# Patient Record
Sex: Female | Born: 1970 | Race: White | Hispanic: No | Marital: Married | Smoking: Former smoker
Health system: Southern US, Community
[De-identification: ages and names within clinical notes are randomized; demographics above are authoritative.]

## PROBLEM LIST (undated history)

## (undated) DIAGNOSIS — K219 Gastro-esophageal reflux disease without esophagitis: Secondary | ICD-10-CM

## (undated) HISTORY — DX: Gastro-esophageal reflux disease without esophagitis: K21.9

---

## 2000-05-20 ENCOUNTER — Other Ambulatory Visit: Admission: RE | Admit: 2000-05-20 | Discharge: 2000-05-20 | Payer: Self-pay | Admitting: Obstetrics and Gynecology

## 2000-07-11 ENCOUNTER — Ambulatory Visit (HOSPITAL_COMMUNITY): Admission: RE | Admit: 2000-07-11 | Discharge: 2000-07-11 | Payer: Self-pay | Admitting: Obstetrics and Gynecology

## 2000-07-11 ENCOUNTER — Encounter: Payer: Self-pay | Admitting: Obstetrics and Gynecology

## 2000-09-21 ENCOUNTER — Ambulatory Visit (HOSPITAL_COMMUNITY): Admission: RE | Admit: 2000-09-21 | Discharge: 2000-09-21 | Payer: Self-pay | Admitting: Obstetrics and Gynecology

## 2000-12-07 ENCOUNTER — Inpatient Hospital Stay (HOSPITAL_COMMUNITY): Admission: AD | Admit: 2000-12-07 | Discharge: 2000-12-09 | Payer: Self-pay | Admitting: Obstetrics and Gynecology

## 2001-07-10 ENCOUNTER — Other Ambulatory Visit: Admission: RE | Admit: 2001-07-10 | Discharge: 2001-07-10 | Payer: Self-pay | Admitting: Obstetrics and Gynecology

## 2002-07-10 ENCOUNTER — Other Ambulatory Visit: Admission: RE | Admit: 2002-07-10 | Discharge: 2002-07-10 | Payer: Self-pay | Admitting: Obstetrics and Gynecology

## 2004-07-15 ENCOUNTER — Other Ambulatory Visit: Admission: RE | Admit: 2004-07-15 | Discharge: 2004-07-15 | Payer: Self-pay | Admitting: Obstetrics and Gynecology

## 2006-09-13 HISTORY — PX: ABDOMINAL HYSTERECTOMY: SHX81

## 2006-10-20 ENCOUNTER — Encounter (INDEPENDENT_AMBULATORY_CARE_PROVIDER_SITE_OTHER): Payer: Self-pay | Admitting: Specialist

## 2006-10-20 ENCOUNTER — Ambulatory Visit (HOSPITAL_COMMUNITY): Admission: RE | Admit: 2006-10-20 | Discharge: 2006-10-21 | Payer: Self-pay | Admitting: Obstetrics and Gynecology

## 2008-04-04 ENCOUNTER — Encounter: Admission: RE | Admit: 2008-04-04 | Discharge: 2008-04-04 | Payer: Self-pay | Admitting: Gastroenterology

## 2011-01-29 NOTE — H&P (Signed)
NAMEARTI, Martha Howell              ACCOUNT NO.:  [id number]   MEDICAL RECORD NO.:  [id number]          PATIENT TYPE:  AMB   LOCATION:  SDC                           FACILITY:  [facility]   PHYSICIAN:  Zenaida Niece, M.D.DATE OF BIRTH:  [birth year]   DATE OF ADMISSION:  DATE OF DISCHARGE:                              HISTORY & PHYSICAL   CHIEF COMPLAINT:  Pelvic relaxation and stress urinary incontinence,  abnormal uterine bleeding.   HISTORY OF PRESENT ILLNESS:  This is a 40 year old white female, para 2-  0-3-2, who was seen for an annual exam in October 2007.  At that time,  she was having irregular periods with occasional heavy flow which  varied.  She also complained of stress urinary incontinence which had  been noticed in the past.  She had a saline infusion ultrasound which  revealed a slightly enlarged uterus with a possible large endometrial  lesion.  All options were discussed with the patient.  To complete her  evaluation, she did have urodynamics which did confirm stress urinary  incontinence.  Again, all options were discussed with the patient and  she wishes to proceed with definitive surgical therapy with hysterectomy  and TVT for her stress incontinence.   PAST OB HISTORY:  Significant for one cesarean section at term and one  VBAC as well as two elective terminations and one spontaneous abortion.   PAST MEDICAL HISTORY:  Significantly only for recent fatigue.   PAST SURGICAL HISTORY:  Cesarean section.   ALLERGIES:  None known.   CURRENT MEDICATIONS:  None.   FAMILY HISTORY:  She has a paternal uncle with colon cancer.   SOCIAL HISTORY:  She is married and denies alcohol, tobacco, or drug  use.   REVIEW OF SYSTEMS:  She does have stress urinary incontinence and  occasional constipation.   PHYSICAL EXAMINATION:  GENERAL:  This is a well developed white female  in no acute distress.  VITAL SIGNS:  Weight 194 pounds, blood pressure 122/100.  NECK:  Supple  without lymphadenopathy or thyromegaly.  LUNGS:  Clear to auscultation.  HEART:  Regular rate and rhythm without murmur.  ABDOMEN:  Soft, nontender, nondistended, without palpable masses.  She  does have a well healed transverse scar.  EXTREMITIES:  No edema, nontender.  PELVIC:  External genitalia has no lesions.  On speculum exam, the  cervix is normal, she does have a minimal cystocele.  On bimanual exam,  she has an anteverted uterus that is upper limits of normal size and  nontender and she does a poor Kegel exercise.  She has no adnexal  masses.   ASSESSMENT:  1. Recent abnormal uterine bleeding with a possible endometrial polyp.      All options have been discussed with the patient and she wishes to      proceed with definitive surgical therapy.  All risks of surgery      have been discussed and she agrees to proceed.  2. Stress urinary incontinence.  This is confirmed by urodynamics.      The patient wishes to have this addressed at the same  time as her      surgery.  Again, she understands the risks of surgery.   PLAN:  Admit the patient on the day of surgery for vaginal hysterectomy  and a TVT.  We will do an anterior colporrhaphy if necessary.      Zenaida Niece, M.D.  Electronically Signed     TDM/MEDQ  D:  10/19/2006  T:  10/19/2006  Job:  [id number]

## 2011-01-29 NOTE — Discharge Summary (Signed)
[facility]  Patient:    Martha Howell, Martha Howell                     MRN: [id number] Adm. Date:  [id number] Disc. Date: [id number] Attending:  Michaele Offer                           Discharge Summary  HISTORY:                      A 40 year old white female, para 1-0-3-1, gravida 5, estimated gestational age 78+ weeks by 8-week ultrasound with [facility] of December 01, 2000 presented complaining of regular contractions, no vaginal bleeding or ruptured membranes, and good fetal movement. Vaginal exam in Maternity Admission Unit showed a cervix to be 2-3 cm, 60%, -2 station that progressed to 4 cm, 60%. Blood group and type was B positive with a negative antibody, RPR was nonreactive, rubella nonimmune, hepatitis B surface antigen negative, HIV negative, GC and Chlamydia negative, declined triple screen, one-hour glucola 146, three-hour GTT 86, 143, 140, and 124, group B strep was negative. The patients prenatal course was complicated by anemia treated with iron, thrombosed hemorrhoid, and recent candidiasis treated with Terazol. She had a prior low transverse cervical C-section after a labor that progressed the cervix to 5 cm and delivery of a 9-pound 7-ounce infant.  OBSTETRIC HISTORY:            In 1988 and 1989, early abortions. 1995, spontaneous abortion. 1999, low transverse cervical C-section at 39 weeks, infant weighed 9 pounds 7 ounces, failure to progress in labor beyond 5 cm.  PAST MEDICAL HISTORY:         Negative.  PAST SURGICAL HISTORY:        As stated.  ALLERGIES:                    No known drug allergies.  SOCIAL HISTORY:               Married, no tobacco.  MEDICATIONS:                  Took iron for anemia.  HOSPITAL COURSE:              On admission, she was afebrile with normal vital signs. Fetal heart tones were reactive with a few early decelerations. Contractions were every three to four minutes. By 7:40 a.m. on December 07, 2000 the cervix was 5 cm, 80%, vertex at a -1. The patient had an epidural. At 8:20 a.m. the contractions were widely spaced. We decided to augment with Pitocin. By 9:26 p.m. the cervix had very slowly progressed to 6 cm, 9 cm, and complete dilatation. At 9:26 p.m. the cervix was fully dilated, vertex, and a +3 station. A dime of caput showed with pushing. After two-plus hours of pushing, the patient asked for assisted delivery. An epidural boost was given and Simpson forceps applied. The vertex was drawn down so that at least a grapefruit-sized amount of head was visible. Left mediolateral episiotomy had been cut, forceps were removed. The patient delivered vertex spontaneously ROA. There was shoulder dystocia managed by McRoberts, woodscrew, and suprapubic pressure. The infant was female, 9 pounds 7 ounces. Apgars of 9 at one and 9 at five minutes. The placenta was intact and very large. The uterus remained boggy and was treated with massage, Pitocin, Methergine, and Hemabate.  The cervical C-section scar was intact. The rectum was negative. Left mediolateral episiotomy repair with 3-0 Dexon and a right vaginal laceration, also repaired with the same suture. A laceration superior to the hemorrhoid was not repaired. Blood loss was 1000 cc.  Postpartum the patient did well. On the first postoperative day her main complaint was hemorrhoids which had been present in late pregnancy. Exam showed a large rosette of hemorrhoids, most prominent at 12 oclock and that one might have been thrombosed. Anusol-HC cream was added to the regimen. On the second postpartum day, the patient felt somewhat improved and was ready for discharge.  LABORATORY DATA:              Hemoglobin, even though it remained stable at 9.4, on the first postpartum day it dropped to a more reasonable 7.3.  FINAL DIAGNOSES:              1. Intrauterine pregnancy at 40+ weeks,                                  delivered ROA.                                2. Prior cesarean section, vaginal birth                                  after cesarean section.                               3. Prolonged second stage of labor.                               4. Shoulder dystocia.                               5. Postpartum hemorrhage.  OPERATION:                    1. Spontaneous delivery ROA after low forceps                                  used to bring the vertex onto the perineum.                               2. Left mediolateral episiotomy and repair.                               3. Right vaginal laceration and repair.  FINAL CONDITION:              Improved.  DISCHARGE INSTRUCTIONS:       Instructions include our regular discharge instruction booklet. The patient is to receive rubella vaccine.  DISCHARGE MEDICATIONS:        The patient is given a prescription for Percocet 5/325, 16 tablets, one every four to six hours as needed for pain.  DISCHARGE FOLLOWUP:           The  patient is asked to return in six weeks for followup examination. DD:  12/09/00 TD:  12/10/00 Job: [id number]

## 2011-01-29 NOTE — Op Note (Signed)
Martha Howell, Martha Howell              ACCOUNT NO.:  [id number]   MEDICAL RECORD NO.:  [id number]          PATIENT TYPE:  AMB   LOCATION:  SDC                           FACILITY:  [facility]   PHYSICIAN:  Zenaida Niece, M.D.DATE OF BIRTH:  1971/03/04   DATE OF PROCEDURE:  10/20/2006  DATE OF DISCHARGE:                               OPERATIVE REPORT   PREOPERATIVE DIAGNOSES:  1. Abnormal uterine bleeding.  2. Cystocele.  3. Stress urinary incontinence.   POSTOPERATIVE DIAGNOSES:  1. Abnormal uterine bleeding.  2. Cystocele.  3. Stress urinary incontinence.   PROCEDURES:  1. Transvaginal hysterectomy.  2. Anterior colporrhaphy and TVT SECUR. .   SURGEON:  Lavina Hamman, M.D.   ASSISTANT:  Sherron Monday, M.D.   ANESTHESIA:  General endotracheal tube.   SPECIMENS:  Uterus, sent to Pathology.   ESTIMATED BLOOD LOSS:  100 mL.   COMPLICATIONS:  None.   FINDINGS:  She had a slightly enlarged uterus.  She had a grade 1  cystocele and normal bladder by cystoscopy.   PROCEDURE IN DETAIL:  The patient was taken to the operating room and  placed in the dorsal supine position.  General anesthesia was induced  and she was placed in mobile stirrups.  Perineum and vagina were then  prepped and draped in the usual sterile fashion.  Bladder was drained  with a red rubber catheter.  A weighted speculum was inserted into the  vagina and cervix was grasped with Christella Hartigan tenaculums.  The  cervicovaginal mucosa was infiltrated with a dilute solution of  Pitressin and incised circumferentially with electrocautery.  Sharp  dissection was then used to free the vagina from the cervix.  Anteriorly, the vagina was pushed off the cervix and uterus and a Deaver  retractor used in this space.  The peritoneum was not yet identified.  The posterior cul-de-sac was identified and entered sharply and a  Bonnano speculum placed into the posterior cul-de-sac.  Uterosacral  ligaments were clamped, transected and  ligated on each side with #1  chromic and tagged for later use.  Anterior peritoneum was identified  and entered sharply and a Deaver retractor used to retract the bladder  anterior.  Uterine arteries, cardinal ligaments and broad ligaments were  clamped, transected and ligated on each side with #1 chromic.  The  uterus was slightly bulky.  It was then delivered through the incision  posteriorly.  Utero-ovarian pedicles were clamped, transected and a  small amount of adhesion posteriorly was taken down with electrocautery.  The uterus was then removed.  Utero-ovarian pedicles were doubly ligated  with #1 chromic and tagged for inspection.  All pedicles were inspected.  A small amount of bleeding was controlled with electrocautery.  The  Bonanno speculum was removed and a shorter speculum was placed.  The  uterosacral ligaments were plicated in midline with 2-0 silk and the  previously tagged uterosacral pedicles were also tied in the midline.   Attention was turned to anterior colporrhaphy.  She did have a small  cystocele.  The vaginal cuff was grasped with Allis clamps and the  vagina  dissected in the midline from the vaginal cuff to approximately  2.5 cm from the urethral meatus.  The cystocele was then mobilized  laterally, both bluntly and sharply.  Bleeding was controlled with  electrocautery.  Cystocele was reduced with interrupted sutures of 2-0  Vicryl.  Excess vaginal mucosa was removed and the vagina was then  closed with a running-locking 2-0 Vicryl.  The vaginal cuff was also  then closed with a running-locking 2-0 Vicryl with adequate closure and  adequate hemostasis.   Attention was turned to the TVT SECUR.  The bladder was emptied with a  Foley catheter.  Allis clamps were placed to make the vaginal incision;  this was done approximately 1 cm proximal to the urethral meatus and was  made for a distance of approximately 1 cm.  This area of vagina and  laterally were  infiltrated with 0.25% Marcaine for hydrodissection.  The  incision was made with a scalpel and then taken out laterally to the  pubic ramus on each side with Metzenbaum scissors; this made a tunnel  wide enough to allow a knife handle to pass through.  The legs were then  lowered halfway in the stirrups.  The TVT SECUR device was first placed  on the patient's right side and then on the left side with good  placement.  A right-angle clamp was able to be passed between the  urethra and the sling and this appeared to be fairly snug.  Cystoscopy  was then performed.  This revealed a normal bladder and good indigo  carmine seen to flow from each ureteral orifice.  The sling was not  visible in the bladder or the urethra and there was no lesion in the  bladder.  The cystoscope was then removed.  The TVT SECUR needles were  then removed, leaving the sling in good position.  The vagina was then  closed with a running-locking 2-0 Vicryl.  A Foley catheter was then  placed.  The vagina was again inspected and found to be hemostatic.  The  vagina was packed with 2-inch gauze with estrogen cream.  All  instruments were then removed from the vagina.  The patient was taken  down from the stirrups.  She was extubated in the operating room  tolerating the procedure well and was taken to the recovery room in  stable condition.  Counts were correct x2, she received Ancef 1 g IV  prior to the procedure and she had PAS hose on throughout the procedure.      Zenaida Niece, M.D.  Electronically Signed     TDM/MEDQ  D:  10/20/2006  T:  10/20/2006  Job:  [id number]

## 2011-07-28 ENCOUNTER — Ambulatory Visit: Payer: Managed Care, Other (non HMO) | Attending: Family Medicine | Admitting: Physical Therapy

## 2011-07-28 DIAGNOSIS — R262 Difficulty in walking, not elsewhere classified: Secondary | ICD-10-CM | POA: Insufficient documentation

## 2011-07-28 DIAGNOSIS — IMO0001 Reserved for inherently not codable concepts without codable children: Secondary | ICD-10-CM | POA: Insufficient documentation

## 2011-07-28 DIAGNOSIS — M6281 Muscle weakness (generalized): Secondary | ICD-10-CM | POA: Insufficient documentation

## 2011-07-28 DIAGNOSIS — M2550 Pain in unspecified joint: Secondary | ICD-10-CM | POA: Insufficient documentation

## 2011-08-03 ENCOUNTER — Ambulatory Visit: Payer: Managed Care, Other (non HMO) | Admitting: Physical Therapy

## 2011-08-12 ENCOUNTER — Ambulatory Visit: Payer: Managed Care, Other (non HMO) | Admitting: Physical Therapy

## 2011-08-17 ENCOUNTER — Encounter: Payer: Commercial Indemnity | Admitting: Physical Therapy

## 2011-08-18 ENCOUNTER — Ambulatory Visit: Payer: Commercial Indemnity | Attending: Family Medicine

## 2011-08-18 DIAGNOSIS — M6281 Muscle weakness (generalized): Secondary | ICD-10-CM | POA: Insufficient documentation

## 2011-08-18 DIAGNOSIS — IMO0001 Reserved for inherently not codable concepts without codable children: Secondary | ICD-10-CM | POA: Insufficient documentation

## 2011-08-18 DIAGNOSIS — M2550 Pain in unspecified joint: Secondary | ICD-10-CM | POA: Insufficient documentation

## 2011-08-18 DIAGNOSIS — R262 Difficulty in walking, not elsewhere classified: Secondary | ICD-10-CM | POA: Insufficient documentation

## 2011-08-20 ENCOUNTER — Ambulatory Visit: Payer: Commercial Indemnity | Admitting: Physical Therapy

## 2011-08-23 ENCOUNTER — Ambulatory Visit: Payer: Commercial Indemnity | Admitting: Physical Therapy

## 2011-08-25 ENCOUNTER — Encounter: Payer: Commercial Indemnity | Admitting: Physical Therapy

## 2011-08-31 ENCOUNTER — Ambulatory Visit: Payer: Commercial Indemnity | Admitting: Physical Therapy

## 2011-09-03 ENCOUNTER — Ambulatory Visit: Payer: Commercial Indemnity | Admitting: Physical Therapy

## 2011-09-08 ENCOUNTER — Encounter: Payer: Commercial Indemnity | Admitting: Physical Therapy

## 2011-11-11 ENCOUNTER — Other Ambulatory Visit: Payer: Self-pay | Admitting: Gastroenterology

## 2011-12-23 ENCOUNTER — Other Ambulatory Visit: Payer: Self-pay | Admitting: Gastroenterology

## 2011-12-23 DIAGNOSIS — R1013 Epigastric pain: Secondary | ICD-10-CM

## 2012-01-14 ENCOUNTER — Ambulatory Visit
Admission: RE | Admit: 2012-01-14 | Discharge: 2012-01-14 | Disposition: A | Discharge status: Home / Self Care | Payer: Commercial Indemnity | Source: Ambulatory Visit | Attending: Gastroenterology | Admitting: Gastroenterology

## 2012-01-14 DIAGNOSIS — R1013 Epigastric pain: Secondary | ICD-10-CM

## 2012-01-27 ENCOUNTER — Ambulatory Visit (INDEPENDENT_AMBULATORY_CARE_PROVIDER_SITE_OTHER): Payer: Commercial Indemnity | Admitting: Surgery

## 2012-01-27 ENCOUNTER — Encounter (INDEPENDENT_AMBULATORY_CARE_PROVIDER_SITE_OTHER): Payer: Self-pay | Admitting: Surgery

## 2012-01-27 VITALS — BP 102/76 | HR 80 | Temp 98.2°F | Resp 14 | Ht 62.75 in | Wt 173.4 lb

## 2012-01-27 DIAGNOSIS — K8000 Calculus of gallbladder with acute cholecystitis without obstruction: Secondary | ICD-10-CM

## 2012-01-27 NOTE — Patient Instructions (Signed)
Cholelithiasis Cholelithiasis (also called gallstones) is a form of gallbladder disease where gallstones form in your gallbladder. The gallbladder is a non-essential organ that stores bile made in the liver, which helps digest fats. Gallstones begin as small crystals and slowly grow into stones. Gallstone pain occurs when the gallbladder spasms, and a gallstone is blocking the duct. Pain can also occur when a stone passes out of the duct.  Women are more likely to develop gallstones than men. Other factors that increase the risk of gallbladder disease are:  Having multiple pregnancies. Physicians sometimes advise removing diseased gallbladders before future pregnancies.   Obesity.   Diets heavy in fried foods and fat.   Increasing age (older than 60).   Prolonged use of medications containing female hormones.   Diabetes mellitus.   Rapid weight loss.   Family history of gallstones (heredity).  SYMPTOMS  Feeling sick to your stomach (nauseous).   Abdominal pain.   Yellowing of the skin (jaundice).   Sudden pain. It may persist from several minutes to several hours.   Worsening pain with deep breathing or when jarred.   Fever.   Tenderness to the touch.  In some cases, when gallstones do not move into the bile duct, people have no pain or symptoms. These are called "silent" gallstones. TREATMENT In severe cases, emergency surgery may be required. HOME CARE INSTRUCTIONS   Only take over-the-counter or prescription medicines for pain, discomfort, or fever as directed by your caregiver.   Follow a low-fat diet until seen again. Fat causes the gallbladder to contract, which can result in pain.   Follow up as instructed. Attacks are almost always recurrent and surgery is usually required for permanent treatment.  SEEK IMMEDIATE MEDICAL CARE IF:   Your pain increases and is not controlled by medications.   You have an oral temperature above 102 F (38.9 C), not controlled by  medication.   You develop nausea and vomiting.  MAKE SURE YOU:   Understand these instructions.   Will watch your condition.   Will get help right away if you are not doing well or get worse.  Document Released: 08/26/2005 Document Revised: 08/19/2011 Document Reviewed: 10/29/2010 [employer] Patient Information 2012 [employer].    Laparoscopic Cholecystectomy Laparoscopic cholecystectomy is surgery to remove the gallbladder. The gallbladder is located slightly to the right of center in the abdomen, behind the liver. It is a concentrating and storage sac for the bile produced in the liver. Bile aids in the digestion and absorption of fats. Gallbladder disease (cholecystitis) is an inflammation of your gallbladder. This condition is usually caused by a buildup of gallstones (cholelithiasis) in your gallbladder. Gallstones can block the flow of bile, resulting in inflammation and pain. In severe cases, emergency surgery may be required. When emergency surgery is not required, you will have time to prepare for the procedure. Laparoscopic surgery is an alternative to open surgery. Laparoscopic surgery usually has a shorter recovery time. Your common bile duct may also need to be examined and explored. Your caregiver will discuss this with you if he or she feels this should be done. If stones are found in the common bile duct, they may be removed. LET YOUR CAREGIVER KNOW ABOUT:  Allergies to food or medicine.   Medicines taken, including vitamins, herbs, eyedrops, over-the-counter medicines, and creams.   Use of steroids (by mouth or creams).   Previous problems with anesthetics or numbing medicines.   History of bleeding problems or blood clots.   Previous   surgery.   Other health problems, including diabetes and kidney problems.   Possibility of pregnancy, if this applies.  RISKS AND COMPLICATIONS All surgery is associated with risks. Some problems that may occur following this  procedure include:  Infection.   Damage to the common bile duct, nerves, arteries, veins, or other internal organs such as the stomach or intestines.   Bleeding.   A stone may remain in the common bile duct.  BEFORE THE PROCEDURE  Do not take aspirin for 3 days prior to surgery or blood thinners for 1 week prior to surgery.   Do not eat or drink anything after midnight the night before surgery.   Let your caregiver know if you develop a cold or other infectious problem prior to surgery.   You should be present 60 minutes before the procedure or as directed.  PROCEDURE  You will be given medicine that makes you sleep (general anesthetic). When you are asleep, your surgeon will make several small cuts (incisions) in your abdomen. One of these incisions is used to insert a small, lighted scope (laparoscope) into the abdomen. The laparoscope helps the surgeon see into your abdomen. Carbon dioxide gas will be pumped into your abdomen. The gas allows more room for the surgeon to perform your surgery. Other operating instruments are inserted through the other incisions. Laparoscopic procedures may not be appropriate when:  There is major scarring from previous surgery.   The gallbladder is extremely inflamed.   There are bleeding disorders or unexpected cirrhosis of the liver.   A pregnancy is near term.   Other conditions make the laparoscopic procedure impossible.  If your surgeon feels it is not safe to continue with a laparoscopic procedure, he or she will perform an open abdominal procedure. In this case, the surgeon will make an incision to open the abdomen. This gives the surgeon a larger view and field to work within. This may allow the surgeon to perform procedures that sometimes cannot be performed with a laparoscope alone. Open surgery has a longer recovery time. AFTER THE PROCEDURE  You will be taken to the recovery area where a nurse will watch and check your progress.   You  may be allowed to go home the same day.   Do not resume physical activities until directed by your caregiver.   You may resume a normal diet and activities as directed.  Document Released: 08/30/2005 Document Revised: 08/19/2011 Document Reviewed: 02/12/2011 [employer] Patient Information 2012 [employer]. 

## 2012-01-27 NOTE — Progress Notes (Signed)
Patient ID: Martha Howell, female   DOB: 10-16-1970, 41 y.o.   MRN: [id number]  Chief Complaint  Patient presents with  . New Evaluation    New Pt. Symptomatic Gallstones    HPI Martha Howell is a 41 y.o. female.   HPI Patient presents at the request of Dr. Zachery Dauer due to right upper quadrant pain. It has been present for 2 months. The pain comes and goes. It is sharp in nature is made worse with fatty meals. The pain is located in her right upper quadrant and epigastrium and sometimes radiates to left upper quadrant. The pain lasted minutes to hours and goes away on its own. She does have associated nausea with it.  Past Medical History  Diagnosis Date  . GERD (gastroesophageal reflux disease)     Past Surgical History  Procedure Date  . Cesarean section 1999  . Abdominal hysterectomy 2008    Family History  Problem Relation Age of Onset  . Cancer Maternal Aunt     Lung Cancer  . Cancer Maternal Uncle     Unknown  . Cancer Paternal Uncle     colon   . Heart disease Maternal Grandfather   . Heart disease Paternal Grandfather     Heart Attack    Social History History  Substance Use Topics  . Smoking status: Former Smoker    Quit date: 09/13/2000  . Smokeless tobacco: Not on file  . Alcohol Use: No    Not on File  Current Outpatient Prescriptions  Medication Sig Dispense Refill  . pantoprazole (PROTONIX) 40 MG tablet         Review of Systems Review of Systems  Constitutional: Negative for fever, chills and unexpected weight change.  HENT: Negative for hearing loss, congestion, sore throat, trouble swallowing and voice change.   Eyes: Negative for visual disturbance.  Respiratory: Negative for cough and wheezing.   Cardiovascular: Negative for chest pain, palpitations and leg swelling.  Gastrointestinal: Negative for nausea, vomiting, abdominal pain, diarrhea, constipation, blood in stool, abdominal distention and anal bleeding.  Genitourinary: Negative  for hematuria, vaginal bleeding and difficulty urinating.  Musculoskeletal: Negative for arthralgias.  Skin: Negative for rash and wound.  Neurological: Negative for seizures, syncope and headaches.  Hematological: Negative for adenopathy. Does not bruise/bleed easily.  Psychiatric/Behavioral: Negative for confusion.    Blood pressure 102/76, pulse 80, temperature 98.2 F (36.8 C), temperature source Temporal, resp. rate 14, height 5' 2.75" (1.594 m), weight 173 lb 6.4 oz (78.654 kg).  Physical Exam Physical Exam  Constitutional: She is oriented to person, place, and time. She appears well-developed and well-nourished.  HENT:  Head: Normocephalic and atraumatic.  Eyes: EOM are normal. Pupils are equal, round, and reactive to light.  Neck: Normal range of motion. Neck supple.  Cardiovascular: Normal rate and regular rhythm.   Pulmonary/Chest: Effort normal and breath sounds normal.  Abdominal: Soft. Bowel sounds are normal. She exhibits no distension. There is no tenderness. There is no rebound.    Musculoskeletal: Normal range of motion.  Neurological: She is alert and oriented to person, place, and time.  Skin: Skin is warm and dry.  Psychiatric: She has a normal mood and affect. Her behavior is normal. Judgment and thought content normal.    Data Reviewed U/S  Multiple gallstones  CBD normal  Assessment    Systematic cholelithiasis    Plan    Laparoscopic cholecystectomy with cholangiogram.  The procedure has been discussed with the patient. Operative and  non operative treatments have been discussed. Risks of surgery include bleeding, infection,  Common bile duct injury,  Injury to the stomach,liver, colon,small intestine, abdominal wall,  Diaphragm,  Major blood vessels,  And the need for an open procedure.  Other risks include worsening of medical problems, death,  DVT and pulmonary embolism, and cardiovascular events.   Medical options have also been discussed. The patient  has been informed of long term expectations of surgery and non surgical options,  The patient agrees to proceed.        Martha Howell A. 01/27/2012, 10:39 AM

## 2012-02-14 ENCOUNTER — Ambulatory Visit (INDEPENDENT_AMBULATORY_CARE_PROVIDER_SITE_OTHER): Payer: Self-pay | Admitting: General Surgery

## 2012-02-23 ENCOUNTER — Ambulatory Visit
Admission: RE | Admit: 2012-02-23 | Discharge: 2012-02-23 | Disposition: A | Discharge status: Home / Self Care | Payer: Managed Care, Other (non HMO) | Source: Ambulatory Visit | Attending: Surgery | Admitting: Surgery

## 2012-02-23 ENCOUNTER — Other Ambulatory Visit (INDEPENDENT_AMBULATORY_CARE_PROVIDER_SITE_OTHER): Payer: Self-pay | Admitting: Surgery

## 2012-02-23 DIAGNOSIS — K8020 Calculus of gallbladder without cholecystitis without obstruction: Secondary | ICD-10-CM

## 2012-02-24 ENCOUNTER — Other Ambulatory Visit (INDEPENDENT_AMBULATORY_CARE_PROVIDER_SITE_OTHER): Payer: Self-pay | Admitting: Surgery

## 2012-02-24 DIAGNOSIS — K8010 Calculus of gallbladder with chronic cholecystitis without obstruction: Secondary | ICD-10-CM

## 2012-03-10 ENCOUNTER — Ambulatory Visit (INDEPENDENT_AMBULATORY_CARE_PROVIDER_SITE_OTHER): Payer: Managed Care, Other (non HMO) | Admitting: Surgery

## 2012-03-10 ENCOUNTER — Encounter (INDEPENDENT_AMBULATORY_CARE_PROVIDER_SITE_OTHER): Payer: Self-pay | Admitting: Surgery

## 2012-03-10 VITALS — BP 114/76 | HR 68 | Temp 97.6°F | Resp 14 | Ht 62.75 in | Wt 176.4 lb

## 2012-03-10 DIAGNOSIS — Z9889 Other specified postprocedural states: Secondary | ICD-10-CM

## 2012-03-10 NOTE — Patient Instructions (Signed)
Return as needed

## 2012-03-10 NOTE — Progress Notes (Signed)
She  is here for a postop visit following laparoscopic cholecystectomy.  Diet is being tolerated, bowels are moving.  No problems with incisions.  PE:  ABD:  Soft, incisions clean/dry/intact and solid. Path shows cholilithiasis  and chronic cholecystitis.  Assessment:  Doing well postop.  Plan:  Lowfat diet recommended.  Activities as tolerated.  Return visit prn.

## 2013-06-22 ENCOUNTER — Other Ambulatory Visit: Payer: Self-pay | Admitting: Obstetrics and Gynecology

## 2013-06-22 DIAGNOSIS — Z1231 Encounter for screening mammogram for malignant neoplasm of breast: Secondary | ICD-10-CM

## 2013-07-05 ENCOUNTER — Ambulatory Visit
Admission: RE | Admit: 2013-07-05 | Discharge: 2013-07-05 | Disposition: A | Discharge status: Home / Self Care | Payer: Commercial Indemnity | Source: Ambulatory Visit | Attending: Obstetrics and Gynecology | Admitting: Obstetrics and Gynecology

## 2013-07-05 DIAGNOSIS — Z1231 Encounter for screening mammogram for malignant neoplasm of breast: Secondary | ICD-10-CM

## 2014-03-11 ENCOUNTER — Encounter (INDEPENDENT_AMBULATORY_CARE_PROVIDER_SITE_OTHER): Payer: Managed Care, Other (non HMO) | Admitting: Surgery

## 2014-08-21 ENCOUNTER — Other Ambulatory Visit: Payer: Self-pay

## 2014-08-21 DIAGNOSIS — Z1231 Encounter for screening mammogram for malignant neoplasm of breast: Secondary | ICD-10-CM

## 2014-09-16 ENCOUNTER — Ambulatory Visit
Admission: RE | Admit: 2014-09-16 | Discharge: 2014-09-16 | Disposition: A | Discharge status: Home / Self Care | Payer: Commercial Indemnity | Source: Ambulatory Visit

## 2014-09-16 DIAGNOSIS — Z1231 Encounter for screening mammogram for malignant neoplasm of breast: Secondary | ICD-10-CM

## 2020-06-16 ENCOUNTER — Ambulatory Visit: Payer: Self-pay

## 2020-06-16 ENCOUNTER — Encounter: Payer: Self-pay | Admitting: Orthopedic Surgery

## 2020-06-16 ENCOUNTER — Ambulatory Visit (INDEPENDENT_AMBULATORY_CARE_PROVIDER_SITE_OTHER): Payer: BC Managed Care – PPO | Admitting: Orthopedic Surgery

## 2020-06-16 DIAGNOSIS — M25572 Pain in left ankle and joints of left foot: Secondary | ICD-10-CM

## 2020-06-21 ENCOUNTER — Encounter: Payer: Self-pay | Admitting: Orthopedic Surgery

## 2020-06-21 NOTE — Progress Notes (Signed)
Office Visit Note   Patient: Martha Howell           Date of Birth: 30-Sep-1970           MRN: [id number] Visit Date: 06/16/2020 Requested by: Juluis Rainier, MD [address] [place],  [place] [address] PCP: Juluis Rainier, MD  Subjective: Chief Complaint  Patient presents with  . Left Ankle - Pain  . Left Leg - Pain    HPI: Martha Howell is a 49 y.o. female who presents to the office complaining of left ankle pain.  Patient complains of lateral left ankle and foot pain.  She notes this pain is associated with numbness and tingling in her lateral left foot as well as pain that travels up the lateral calf about midway up the lower leg.  She has history of left peroneal tendon repair in November 2020 by Dr. Adele Barthel.  She notes that this provided some relief from pain she was having but she does note residual numbness.  She has been taking meloxicam and using Voltaren gel with some relief.  She was seen by Dr. Althea Charon in September 2021 who recommended that she return to using a walker boot for about a week but patient states that this made her pain worse.  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  She does not have a formal occupation but she does state him doing housework..                ROS: All systems reviewed are negative as they relate to the chief complaint within the history of present illness.  Patient denies fevers or chills.  Assessment & Plan: Visit Diagnoses:  1. Pain in left ankle and joints of left foot     Plan: Patient is a 49 year old female presents for evaluation of left lateral ankle pain.  She has history of left peroneal tendon repair in December 2020.  Op note was reviewed from this procedure.  She has had persistent numbness since the procedure and she complains of persistent pain as well over the last several months.  Impression is that patient is dealing  with postop neuroma.  She has good strength of the peroneal tendon on exam though she has increased sensitivity over the course of the peroneal tendon just distal to the incision from her surgery.  Patient also has numbness in the sural nerve distribution.  This is in the region of her incision.  In general the patient wanted some assurance that nothing serious was wrong with her ankle and I do not see a structural problem at this time.  Plan for patient to control symptoms with over-the-counter medications and follow-up as needed.  Follow-Up Instructions: No follow-ups on file.   Orders:  Orders Placed This Encounter  Procedures  . XR Ankle Complete Left   No orders of the defined types were placed in this encounter.     Procedures: No procedures performed   Clinical Data: No additional findings.  Objective: Vital Signs: There were no vitals taken for this visit.  Physical Exam:  Constitutional: Patient appears well-developed HEENT:  Head: Normocephalic Eyes:EOM are normal Neck: Normal range of motion Cardiovascular: Normal rate Pulmonary/chest: Effort normal Neurologic: Patient is alert Skin: Skin is warm Psychiatric: Patient has normal mood and affect  Ortho Exam: Ortho exam demonstrates tenderness diffusely throughout the lateral left foot and  ankle, mostly centered around the course of the peroneal tendon.  She has severe sensitivity to light touch.  No significant pain with resisted peroneal strength testing.  No tenderness over the lateral malleolus or ATFL, CFL.  Achilles tendon and anterior tibialis tendon intact.  No significant tenderness palpation over the Lisfranc complex, phalanges, metatarsals, retrocalcaneal space, posterior tibialis tendon insertion, medial malleolus.  Active dorsiflexion/plantarflexion/inversion/eversion intact.  2+ DP pulse.  No calf tenderness on exam.  Specialty Comments:  No specialty comments available.  Imaging: No results  found.   PMFS History: Patient Active Problem List   Diagnosis Date Noted  . Gallstones and inflammation of gallbladder without obstruction 01/27/2012   Past Medical History:  Diagnosis Date  . GERD (gastroesophageal reflux disease)     Family History  Problem Relation Age of Onset  . Cancer Maternal Aunt        Lung Cancer  . Cancer Maternal Uncle        Unknown  . Cancer Paternal Uncle        colon   . Heart disease Maternal Grandfather   . Heart disease Paternal Grandfather        Heart Attack    Past Surgical History:  Procedure Laterality Date  . ABDOMINAL HYSTERECTOMY  2008  . CESAREAN SECTION  1999   Social History   Occupational History  . Not on file  Tobacco Use  . Smoking status: Former Smoker    Quit date: 09/13/2000    Years since quitting: 19.7  . Smokeless tobacco: Never Used  Substance and Sexual Activity  . Alcohol use: No  . Drug use: No  . Sexual activity: Not on file

## 2020-06-22 ENCOUNTER — Encounter: Payer: Self-pay | Admitting: Orthopedic Surgery

## 2021-08-17 ENCOUNTER — Ambulatory Visit (INDEPENDENT_AMBULATORY_CARE_PROVIDER_SITE_OTHER): Payer: No Typology Code available for payment source | Admitting: Orthopedic Surgery

## 2021-08-17 ENCOUNTER — Ambulatory Visit: Payer: Self-pay

## 2021-08-17 ENCOUNTER — Other Ambulatory Visit: Payer: Self-pay

## 2021-08-17 DIAGNOSIS — M25571 Pain in right ankle and joints of right foot: Secondary | ICD-10-CM | POA: Diagnosis not present

## 2021-08-18 ENCOUNTER — Encounter: Payer: Self-pay | Admitting: Orthopedic Surgery

## 2021-08-18 NOTE — Progress Notes (Signed)
Office Visit Note   Patient: Martha Howell           Date of Birth: 05/02/1971           MRN: [id number] Visit Date: 08/17/2021 Requested by: No referring provider defined for this encounter. PCP: Pcp, No  Subjective: Chief Complaint  Patient presents with   Right Ankle - Pain   Right Foot - Pain    HPI: Martha Howell is a 50 y.o. female who presents to the office complaining of right ankle and foot pain.  Patient states she has had increased pain in the ankle and foot over the last several months without any sort of injury.  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  She has never had right ankle pain before but she has had left ankle pain with previous left peroneal tendon repair at an outside facility several years ago.  This feels somewhat similar to the symptoms she was having prior to that peroneal tendon repair.  She has no history of previous right ankle surgery.  She describes lateral posterior right ankle pain with radiation of burning pain to the lateral foot.  She has burning sensation in her Achilles.  Pain shoots up the lateral posterior calf about midway.  Ankle feels unstable to her and feels like it wants to give out at random times.  The symptoms are worse when she is walking on uneven terrain.  Pain is waking her up at night.  She has been able to continue playing tennis and notices burning pain that comes on after about an hour of playing.  She does endorse low back pain but this is a chronic problem for her that she has had since 2013 and she has no new increase in her low back pain.  No radicular pain.  Denies any history of diabetes or smoking.                ROS: All systems reviewed are negative as they relate to the chief complaint within the history of present illness.  Patient denies fevers or chills.  Assessment & Plan: Visit Diagnoses:  1. Pain in right ankle and joints of right foot     Plan: Patient is a  50 year old female who presents for evaluation of right ankle and foot pain.  She has similar symptoms compared with her prior left ankle pain that ended up being a left peroneal tendon tear.  She also has tenderness over the fifth metatarsal base and shaft on exam today.  Radiographs negative for any significant degenerative changes or fracture/dislocation.  She did have MRI with her left peroneal tendon tear that was negative for peroneal tear but ended up going to surgery anyway and was diagnosed while in the operating room.  Ordered MRI of the right ankle for evaluation of peroneal tendinitis versus peroneal tendinopathy.  Ordered MRI of the right foot to evaluate for fifth metatarsal stress reaction.  Ordered nerve conduction study of the right foot to evaluate sural neuropathy.  Follow-up afterward to review results of studies.  Follow-Up Instructions: No follow-ups on file.   Orders:  Orders Placed This Encounter  Procedures   XR Foot Complete Right   XR Ankle Complete Right   MR Foot Right w/o contrast   MR Ankle Right w/o contrast   Ambulatory referral to Neurology   No orders of the defined types were placed in this encounter.  Procedures: No procedures performed   Clinical Data: No additional findings.  Objective: Vital Signs: There were no vitals taken for this visit.  Physical Exam:  Constitutional: Patient appears well-developed HEENT:  Head: Normocephalic Eyes:EOM are normal Neck: Normal range of motion Cardiovascular: Normal rate Pulmonary/chest: Effort normal Neurologic: Patient is alert Skin: Skin is warm Psychiatric: Patient has normal mood and affect  Ortho Exam: Ortho exam demonstrates right ankle with intact active dorsiflexion, plantarflexion, inversion, eversion.  Palpable pedal pulses of the right lower extremity.  No calf tenderness.  Negative Homans' sign.  Mild to moderate tenderness over the fifth metatarsal base.  No tenderness over the Lisfranc  complex, medial mall, lateral mall, Achilles tendon.  Achilles tendon is palpable and intact by palpation.  Excellent eversion strength with some increased pain.  Decreased sensation along the lateral aspect of the foot and the sural nerve distribution slightly.  Intact sensation through the dorsum, plantar aspects of the central and medial foot as well as the first webspace.  EHL intact.  Anterior tibialis tendon intact 5 palpation.  No tenderness through the forefoot except for some moderate tenderness over the shaft of the fifth metatarsal.  Specialty Comments:  No specialty comments available.  Imaging: No results found.   PMFS History: Patient Active Problem List   Diagnosis Date Noted   Gallstones and inflammation of gallbladder without obstruction 01/27/2012   Past Medical History:  Diagnosis Date   GERD (gastroesophageal reflux disease)     Family History  Problem Relation Age of Onset   Cancer Maternal Aunt        Lung Cancer   Cancer Maternal Uncle        Unknown   Cancer Paternal Uncle        colon    Heart disease Maternal Grandfather    Heart disease Paternal Grandfather        Heart Attack    Past Surgical History:  Procedure Laterality Date   ABDOMINAL HYSTERECTOMY  2008   CESAREAN SECTION  1999   Social History   Occupational History   Not on file  Tobacco Use   Smoking status: Former    Types: Cigarettes    Quit date: 09/13/2000    Years since quitting: 20.9   Smokeless tobacco: Never  Substance and Sexual Activity   Alcohol use: No   Drug use: No   Sexual activity: Not on file

## 2021-08-20 ENCOUNTER — Other Ambulatory Visit: Payer: Self-pay

## 2021-08-20 ENCOUNTER — Encounter: Payer: Self-pay | Admitting: Neurology

## 2021-08-20 DIAGNOSIS — R202 Paresthesia of skin: Secondary | ICD-10-CM

## 2021-08-28 ENCOUNTER — Telehealth: Payer: Self-pay | Admitting: Orthopedic Surgery

## 2021-08-28 NOTE — Telephone Encounter (Signed)
left message for pt to return call and get sch'd for a follow up with Dr. August Saucer, after 09/22/21 (post MRIs of right foot/ankle and EMG/NCV).

## 2021-09-08 ENCOUNTER — Ambulatory Visit
Admission: RE | Admit: 2021-09-08 | Discharge: 2021-09-08 | Disposition: A | Discharge status: Home / Self Care | Payer: No Typology Code available for payment source | Source: Ambulatory Visit | Attending: Orthopedic Surgery | Admitting: Orthopedic Surgery

## 2021-09-08 ENCOUNTER — Other Ambulatory Visit: Payer: Self-pay

## 2021-09-08 DIAGNOSIS — M25571 Pain in right ankle and joints of right foot: Secondary | ICD-10-CM

## 2021-09-22 ENCOUNTER — Encounter: Payer: Self-pay | Admitting: Orthopedic Surgery

## 2021-09-22 ENCOUNTER — Other Ambulatory Visit: Payer: Self-pay

## 2021-09-22 ENCOUNTER — Ambulatory Visit (INDEPENDENT_AMBULATORY_CARE_PROVIDER_SITE_OTHER): Payer: No Typology Code available for payment source | Admitting: Neurology

## 2021-09-22 DIAGNOSIS — R202 Paresthesia of skin: Secondary | ICD-10-CM | POA: Diagnosis not present

## 2021-09-22 NOTE — Procedures (Signed)
[facility]  [address] [place], [address]  [place], [place] [address] Tel: [phone] Fax:  [phone] Test Date:  09/22/2021  Patient: Martha Howell DOB: 1971/07/14 Physician: Nita Sickle, DO  Sex: Female Height: 5\' 3"  Ref Phys: , MD  ID#: [id number]   Technician:    Patient Complaints: This is a 51 year old female referred for evaluation of right foot pain.  NCV & EMG Findings: Extensive electrodiagnostic testing of the right lower extremity shows:  Right sural and superficial peroneal sensory responses are within normal limits. Right peroneal and tibial motor responses are within normal limits. Right tibial H reflex study is within normal limits. There is no evidence of active or chronic motor axonal loss changes affecting any of the tested muscles.  Motor unit configuration and recruitment pattern is within normal limits.  Impression: This is a normal study of the right lower extremity.  In particular, there is no evidence of sural neuropathy, sensorimotor polyneuropathy, or lumbosacral radiculopathy.   ___________________________ 44, DO    Nerve Conduction Studies Anti Sensory Summary Table   Stim Site NR Peak (ms) Norm Peak (ms) P-T Amp (V) Norm P-T Amp  Right Sup Peroneal Anti Sensory (Ant Lat Mall)  35C  12 cm    2.1 <4.6 20.6 >4  Right Sural Anti Sensory (Lat Mall)  35C  Calf    2.2 <4.6 19.4 >4   Motor Summary Table   Stim Site NR Onset (ms) Norm Onset (ms) O-P Amp (mV) Norm O-P Amp Site1 Site2 Delta-0 (ms) Dist (cm) Vel (m/s) Norm Vel (m/s)  Right Peroneal Motor (Ext Dig Brev)  35C  Ankle    2.0 <6.0 3.3 >2.5 B Fib Ankle 6.8 37.0 54 >40  B Fib    8.8  2.9  Poplt B Fib 1.3 7.0 54 >40  Poplt    10.1  2.9         Right Tibial Motor (Abd Hall Brev)  35C  Ankle    2.0 <6.0 14.9 >4 Knee Ankle 7.5 39.0 52 >40  Knee    9.5  10.0          H Reflex Studies   NR H-Lat (ms) Lat Norm (ms) L-R H-Lat (ms)  Right Tibial  (Gastroc)  35C     30.48 <35    EMG   Side Muscle Ins Act Fibs Psw Fasc Number Recrt Dur Dur. Amp Amp. Poly Poly. Comment  Right AntTibialis Nml Nml Nml Nml Nml Nml Nml Nml Nml Nml Nml Nml N/A  Right Gastroc Nml Nml Nml Nml Nml Nml Nml Nml Nml Nml Nml Nml N/A  Right Flex Dig Long Nml Nml Nml Nml Nml Nml Nml Nml Nml Nml Nml Nml N/A  Right RectFemoris Nml Nml Nml Nml Nml Nml Nml Nml Nml Nml Nml Nml N/A  Right BicepsFemS Nml Nml Nml Nml Nml Nml Nml Nml Nml Nml Nml Nml N/A      Waveforms:

## 2021-09-28 NOTE — Telephone Encounter (Signed)
I called.  MRI scan ankle MRI foot look pretty reasonable.  Peroneus brevis okay.  Has a little bit of edema in the cuboid but nothing really like a stress reaction or stress fracture.  EMG nerve study was normal for no sural nerve compression.  I think we should observe this for now.  No operative indication.  If her symptoms change she should come back and we would likely have her see Dr. Sharol Given for evaluation and management potentially.

## 2021-10-30 ENCOUNTER — Ambulatory Visit: Payer: No Typology Code available for payment source | Admitting: Orthopedic Surgery

## 2022-04-12 IMAGING — MR MR ANKLE*R* W/O CM
5 series · 40 of 40 positions shown · non-contrast
Comparison: X-ray ankle/foot 08/17/2021.

CLINICAL DATA: Right lateral ankle and base of the fifth metatarsal
pain since [DATE]. Clinical concern for peroneal tendinitis.

EXAM:
MRI OF THE RIGHT ANKLE WITHOUT CONTRAST
TECHNIQUE: Multiplanar, multisequence MR imaging of the ankle was performed. No
intravenous contrast was administered.

[Series 1: T2 fat-sat · axial · 3.0mm · 0.62mm/px · z∈[-105,+16]mm · 9 of 32 slices shown (1 of 2)]
[im 1/32]
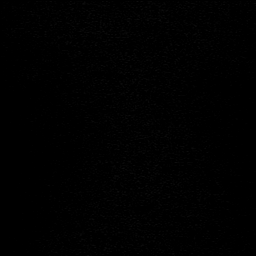
[im 4/32]
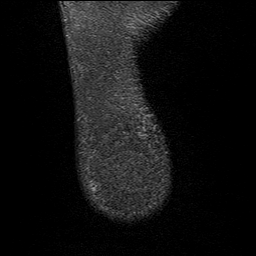
[im 8/32]
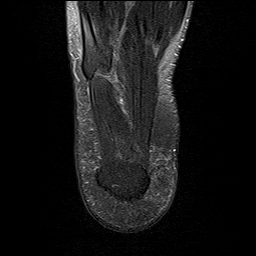
[im 12/32]
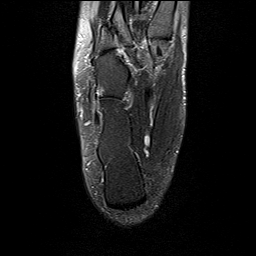
[im 16/32]
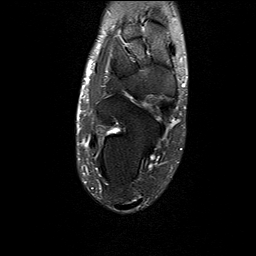
[im 20/32]
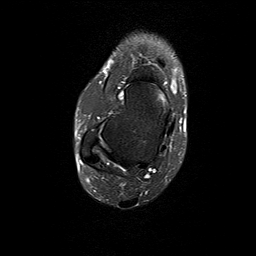
[im 24/32]
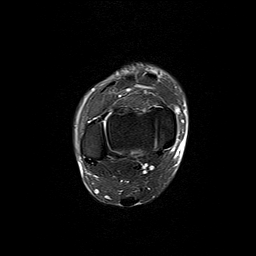
[im 28/32]
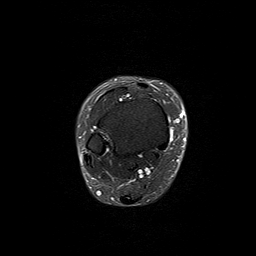
[im 32/32]
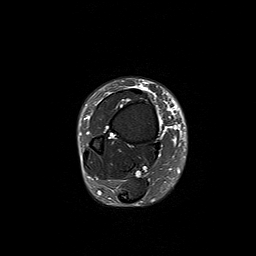

[Series 2: PD fat-sat · axial · 3.0mm · 0.50mm/px · z∈[-105,+16]mm · 9 of 32 slices shown]
[im 1/32]
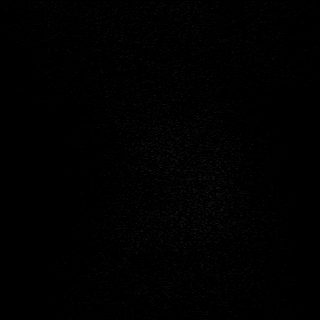
[im 4/32]
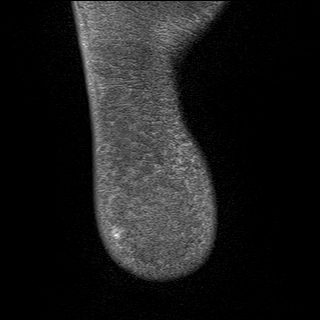
[im 8/32]
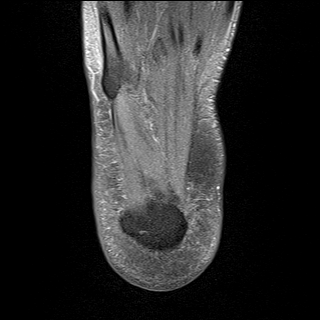
[im 12/32]
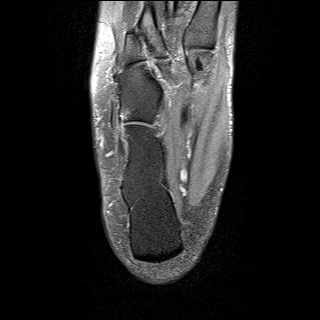
[im 16/32]
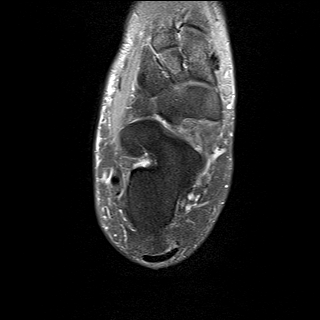
[im 20/32]
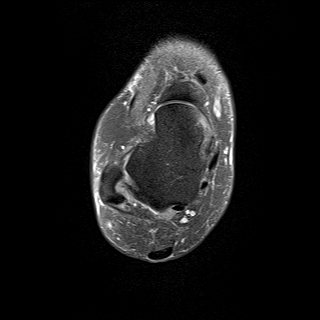
[im 24/32]
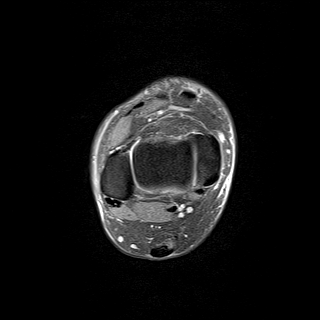
[im 28/32]
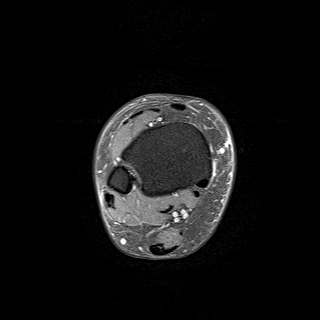
[im 32/32]
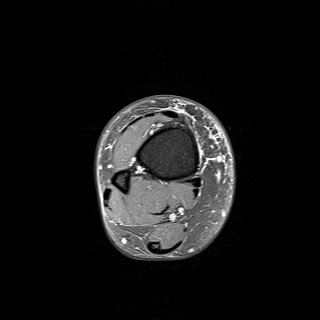

[Series 3: T1 · sagittal · 4.0mm · 0.56mm/px · 6 of 22 slices shown]
[im 1/22]
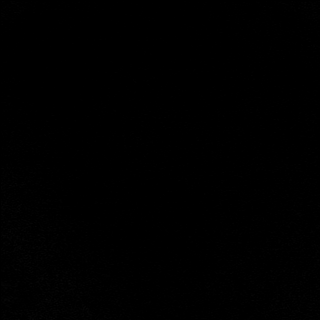
[im 5/22]
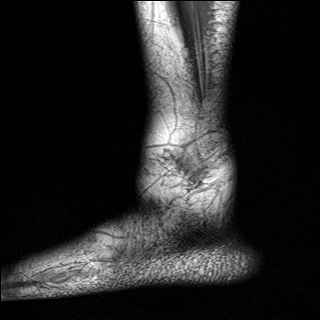
[im 9/22]
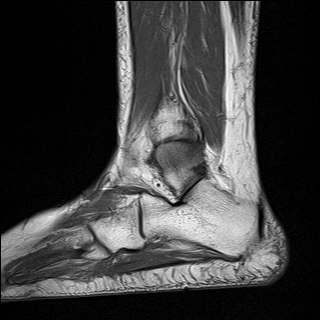
[im 13/22]
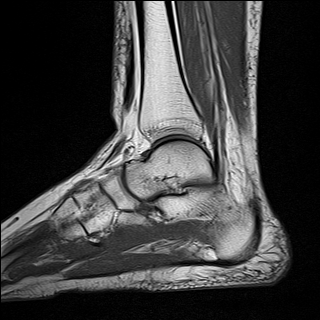
[im 17/22]
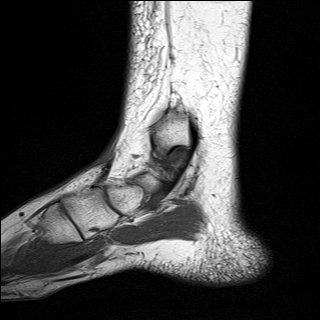
[im 22/22]
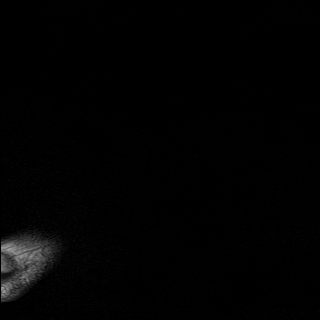

[Series 4: STIR · sagittal · 4.0mm · 0.35mm/px · 6 of 22 slices shown]
[im 1/22]
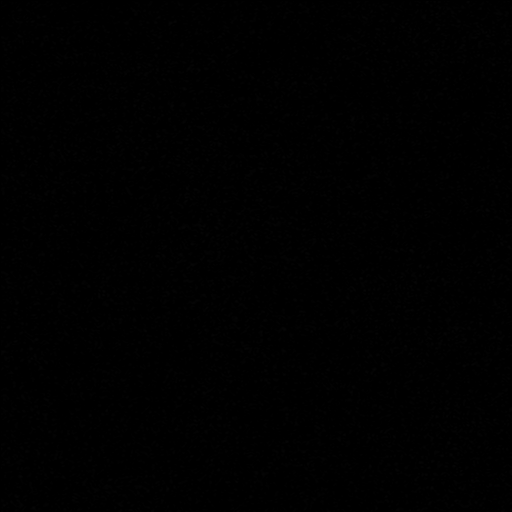
[im 5/22]
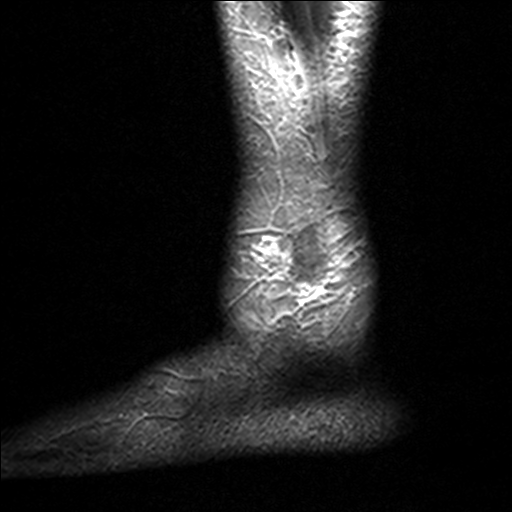
[im 9/22]
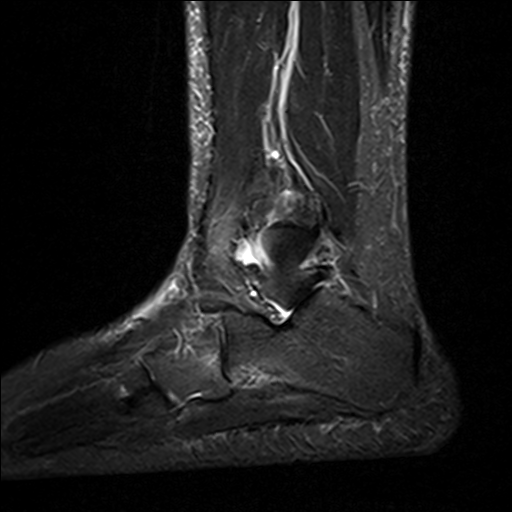
[im 13/22]
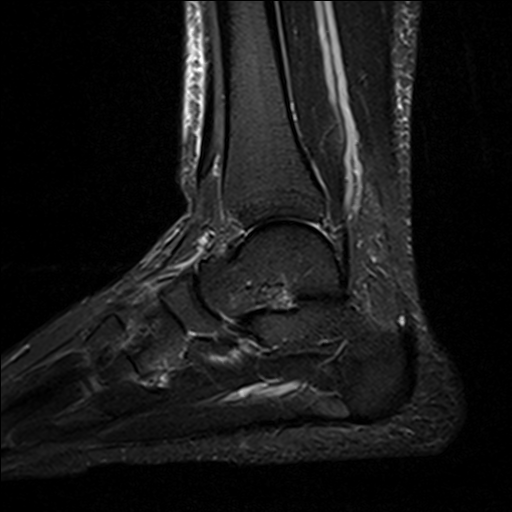
[im 17/22]
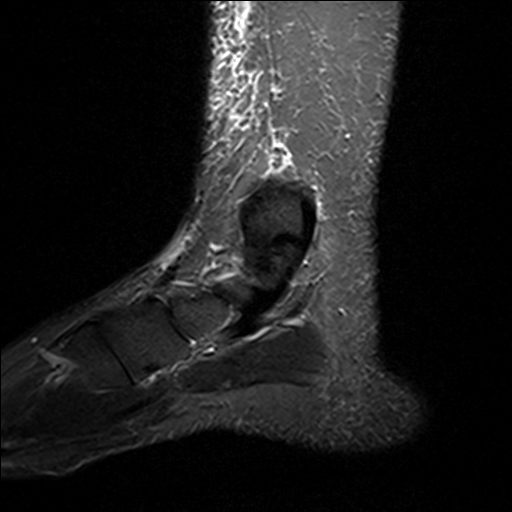
[im 22/22]
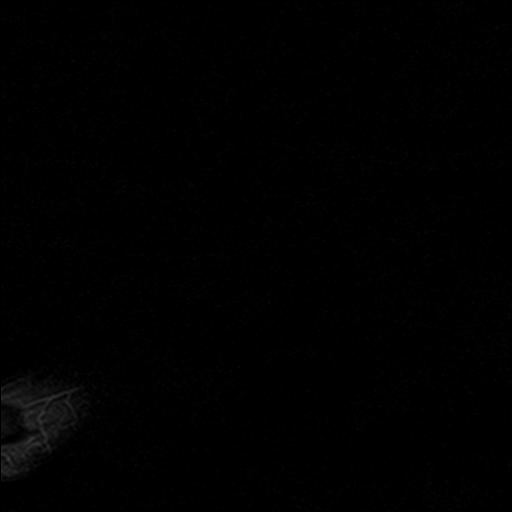

[Series 5: T2 fat-sat · coronal · 3.0mm · 0.50mm/px · 10 of 36 slices shown (2 of 2)]
[im 1/36]
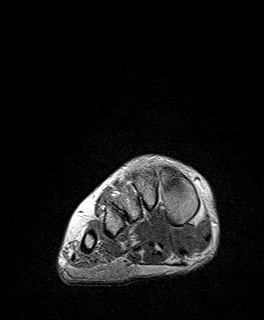
[im 4/36]
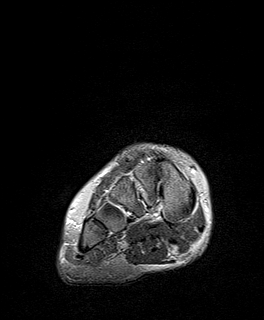
[im 8/36]
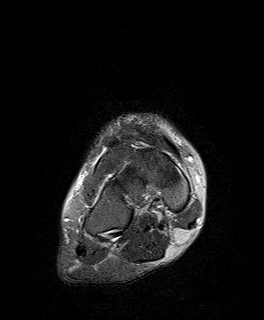
[im 12/36]
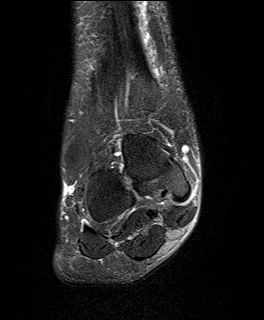
[im 16/36]
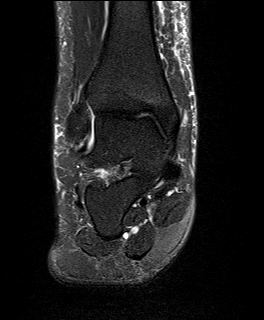
[im 20/36]
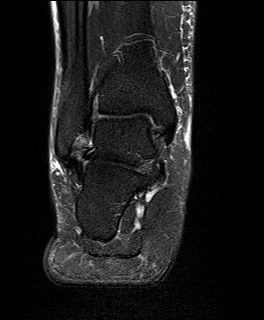
[im 24/36]
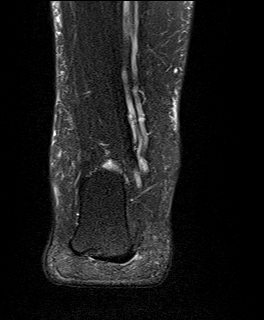
[im 28/36]
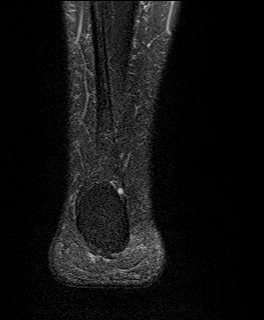
[im 32/36]
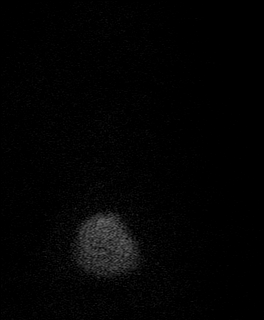
[im 36/36]
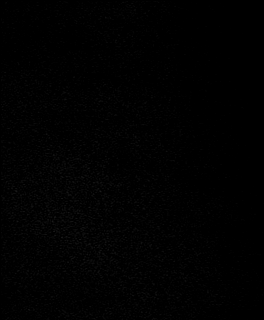

[40 of 40 positions shown; findings below may reference images not displayed]

FINDINGS: TENDONS

Peroneal: The distal peroneus brevis tendon appears thin distally
but is seen through its insertion on the base of the fifth
metatarsal. The peroneal longus tendon is intact.

Posteromedial: Intact tibialis posterior, flexor hallucis longus and
flexor digitorum longus tendons.

Anterior: Intact tibialis anterior, extensor hallucis longus and
extensor digitorum longus tendons.

Achilles: Intact.

Plantar Fascia: Intact.

LIGAMENTS

Lateral: Anterior talofibular ligament intact. Calcaneofibular
ligament intact. Posterior talofibular ligament intact. Anterior and
posterior tibiofibular ligaments intact. The lateral calcaneocuboid
ligament is seen and appears taut/intact.

Medial: Deltoid ligament intact. Spring ligament intact.

CARTILAGE

Ankle Joint: No joint effusion. Normal ankle mortise. No chondral
defect.

Subtalar Joints/Sinus Tarsi: There is mild edema within the talar
head at the middle subtalar facet, likely related to arthritis. No
subtalar joint effusion. Normal sinus tarsi.

Bones: There is focal bone marrow edema within the proximal lateral
aspect of the cuboid, at the lateral calcaneocuboid ligament
insertion, with adjacent cortical irregularity (axial T2 image 21).
There is mild focal marrow edema within the talar head at the
talonavicular joint, likely related to arthritis.

Soft Tissue: No fluid collection or hematoma. Muscles are normal
without edema or atrophy. Tarsal tunnel is normal.
IMPRESSION: 1. Mild focal bone marrow edema within the proximal lateral aspect
of the cuboid, at the lateral calcaneocuboid ligament insertion with
adjacent cortical irregularity suggestive of an avulsion injury.
2. Thinned appearance of the distal peroneus brevis tendon, which
can be seen in chronic partial tearing. No significant
tenosynovitis.
3. Intact ankle ligaments.

## 2022-07-01 ENCOUNTER — Encounter: Payer: Self-pay | Admitting: Physician Assistant

## 2022-08-03 ENCOUNTER — Ambulatory Visit: Payer: No Typology Code available for payment source | Admitting: Physician Assistant

## 2022-12-09 ENCOUNTER — Ambulatory Visit (INDEPENDENT_AMBULATORY_CARE_PROVIDER_SITE_OTHER): Payer: 59 | Admitting: Internal Medicine

## 2022-12-09 ENCOUNTER — Encounter: Payer: Self-pay | Admitting: Internal Medicine

## 2022-12-09 VITALS — BP 120/88 | HR 112 | Temp 98.1°F | Ht 63.0 in | Wt 215.0 lb

## 2022-12-09 DIAGNOSIS — J209 Acute bronchitis, unspecified: Secondary | ICD-10-CM

## 2022-12-09 DIAGNOSIS — R053 Chronic cough: Secondary | ICD-10-CM | POA: Diagnosis not present

## 2022-12-09 LAB — CBC WITH DIFFERENTIAL/PLATELET
Basophils Absolute: 0 10*3/uL (ref 0.0–0.1)
Basophils Relative: 0.2 % (ref 0.0–3.0)
Eosinophils Absolute: 0 10*3/uL (ref 0.0–0.7)
Eosinophils Relative: 0 % (ref 0.0–5.0)
HCT: 43.6 % (ref 36.0–46.0)
Hemoglobin: 14.6 g/dL (ref 12.0–15.0)
Lymphocytes Relative: 12.2 % (ref 12.0–46.0)
Lymphs Abs: 2 10*3/uL (ref 0.7–4.0)
MCHC: 33.6 g/dL (ref 30.0–36.0)
MCV: 86.4 fl (ref 78.0–100.0)
Monocytes Absolute: 0.3 10*3/uL (ref 0.1–1.0)
Monocytes Relative: 2.1 % — ABNORMAL LOW (ref 3.0–12.0)
Neutro Abs: 14.1 10*3/uL — ABNORMAL HIGH (ref 1.4–7.7)
Neutrophils Relative %: 85.5 % — ABNORMAL HIGH (ref 43.0–77.0)
Platelets: 425 10*3/uL — ABNORMAL HIGH (ref 150.0–400.0)
RBC: 5.05 Mil/uL (ref 3.87–5.11)
RDW: 14.2 % (ref 11.5–15.5)
WBC: 16.5 10*3/uL — ABNORMAL HIGH (ref 4.0–10.5)

## 2022-12-09 LAB — BRAIN NATRIURETIC PEPTIDE: Pro B Natriuretic peptide (BNP): 16 pg/mL (ref 0.0–100.0)

## 2022-12-09 NOTE — Patient Instructions (Addendum)
ICD-10-CM   1. Chronic cough  R05.3     2. Bronchitis with bronchospasm  J20.9      This could be postviral reactive cough with upper airway transmitted wheeze versus upper airway cough syndrome otherwise call cough neuropathy or irritable larynx syndrome versus cough variant asthma.  With ongoing prednisone and recent onset it is really hard to sort out what exactly is going on.  Plan - Complete 12-day prednisone [today's day 3] -Check blood test today for pertussis antibodies and blood BNP - In 4 weeks check blood work for CBC with differential and RAST allergy panel   -We need you to be off prednisone for at least 1 week at the time of this blood work -In 4 weeks to full pulmonary function test -Also do voice rest for cough  -Take 2 full days and do not whisper or talk  -Add any urged to cough drink water or suck on sugarless lozenge -Okay to stop schedule DuoNeb but instead take Symbicort 15-20 minutes before exercise and also do warm up and cool down before and after exercise  Follow-up - Return to see myself or nurse practitioner in 4 weeks to review symptoms and cough score  -Based on results and progress might need CT scan of the sinus and HRCT scan of the chest  -Stay in touch with [place]

## 2022-12-09 NOTE — Progress Notes (Signed)
OV 12/09/2022  Subjective:  Patient ID: Martha Howell, female , DOB: 1970/12/18 , age 52 y.o. , MRN: [id number] , ADDRESS: [place] [id number] PCP Sue Lush, PA-C Patient Care Team: Elinor Parkinson as PCP - General (Physician Assistant)  This Provider for this visit: Treatment Team:  Attending Provider: Brand Males, MD    12/09/2022 -  cough and bronchitis.     HPI Martha Howell 52 y.o. 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  She is an avid Firefighter.  Today she played tennis with the wife of Dr. Jenetta Downer in our practice..  She tells me that she has been always well.  The only medical issues being that during fall season she will have a mild ragweed allergy but no formal allergy testing.  Then in 2022 did have mild COVID-19.  Otherwise living a quite healthy life.  She does suffer from hypertension and prediabetes and some occasional headaches.  Then around Christmas 2023 suffered from what she calls bronchitis.  She describes cough that was junky with sputum production but periodically dry with a barking quality.  She says she did have sinus congestion at the time but no fever.  She saw primary care around September 16, 2022 was given prednisone and Symbicort and it seemed to resolve only for the symptoms to come back by the end of February 2024.  Then early March 2024 got another round of prednisone and seem to be getting better but again towards the end of March 2024 last week symptoms are back again.  She got a Depo-Medrol.  She does not feel the steroids are helping except very transiently.  She has been given nebulizers that seem to make things worse after 30 minutes.  Symptoms are made worse by talking or laughing.  The nebulizer also makes it worse an hour later and it also happens randomly.  Sleeping makes things better.  Drinking water makes things better.  Early morning when she gets up she feels she can hear [place]  mild wheeze.  She recollects a chest x-ray at the primary care office is normal.  I do not have this for my personal visualization.  Last chest x-ray within our system was 11 years ago and it was normal.  Of note while playing tennis she does not cough except between points.  Cough related history - Acid reflux: Denies - Symptoms: Present at this time during Christmas - Asthma history: Only in the uncle - Blood pressure not on lisinopril. = feNO 12/09/2022 - 8ppb -She is currently on day 3 out of 12 of prednisone    Dr Lorenza Cambridge Reflux Symptom Index (> 13-15 suggestive of LPR cough) 0 -> 5  =  none ->severe problem 12/09/2022   Hoarseness of problem with voice 5  Clearing  Of Throat 3  Excess throat mucus or feeling of post nasal drip 4  Difficulty swallowing food, liquid or tablets 0  Cough after eating or lying down 2  Breathing difficulties or choking episodes 4  Troublesome or annoying cough 5  Sensation of something sticking in throat or lump in throat 1  Heartburn, chest pain, indigestion, or stomach acid coming up 0  TOTAL 22     CT Chest data  No results found.    PFT      No data to display          CXR 2013  RADIOLOGY  REPORT*   Clinical Data: Preop for cholecystectomy for gallstones   CHEST - 2 VIEW   Comparison: Ultrasound abdomen of 01/14/2012   Findings: The lungs are clear.  Mediastinal contours appear normal.  The heart is within normal limits in size.  No bony abnormality is  seen.   IMPRESSION:  No active lung disease.   Original Report Authenticated By: Joretta Bachelor, M.D.     has a past medical history of GERD (gastroesophageal reflux disease).   reports that she quit smoking about 22 years ago. Her smoking use included cigarettes. She has never used smokeless tobacco.  Past Surgical History:  Procedure Laterality Date   ABDOMINAL HYSTERECTOMY  2008   CESAREAN SECTION  1999    Allergies  Allergen Reactions   Citalopram Nausea  Only    Immunization History  Administered Date(s) Administered   Influenza Inj Mdck Quad Pf 06/25/2018, 07/21/2022   Influenza,inj,Quad PF,6+ Mos 06/13/2019   Influenza,inj,Quad PF,6-35 Mos 06/06/2019   Influenza-Unspecified 07/24/2015, 05/29/2016, 06/14/2017   PFIZER(Purple Top)SARS-COV-2 Vaccination 12/02/2019, 12/23/2019, 07/19/2020   Tdap 07/24/2015    Family History  Problem Relation Age of Onset   Cancer Maternal Aunt        Lung Cancer   Cancer Maternal Uncle        Unknown   Cancer Paternal Uncle        colon    Heart disease Maternal Grandfather    Heart disease Paternal Grandfather        Heart Attack     Current Outpatient Medications:    albuterol (VENTOLIN HFA) 108 (90 Base) MCG/ACT inhaler, Inhale into the lungs., Disp: , Rfl:    azelastine (ASTELIN) 0.1 % nasal spray, one spray by Both Nostrils route 2 (two) times daily. Use in each nostril as directed, Disp: , Rfl:    budesonide-formoterol (SYMBICORT) 160-4.5 MCG/ACT inhaler, Inhale into the lungs., Disp: , Rfl:    ipratropium-albuterol (DUONEB) 0.5-2.5 (3) MG/3ML SOLN, Take 3 mLs by nebulization every 4 (four) hours as needed., Disp: , Rfl:    predniSONE (STERAPRED UNI-PAK 21 TAB) 10 MG (21) TBPK tablet, Take by mouth., Disp: , Rfl:    sertraline (ZOLOFT) 25 MG tablet, , Disp: , Rfl:    escitalopram (LEXAPRO) 5 MG tablet, Take 5 mg by mouth daily. (Patient not taking: Reported on 12/09/2022), Disp: , Rfl:    pantoprazole (PROTONIX) 40 MG tablet, 40 mg as needed.  (Patient not taking: Reported on 12/09/2022), Disp: , Rfl:       Objective:   Vitals:   12/09/22 1334  BP: 120/88  Pulse: (!) 112  Temp: 98.1 F (36.7 C)  TempSrc: Oral  SpO2: 92%  Weight: 215 lb (97.5 kg)  Height: 5\' 3"  (1.6 m)    Estimated body mass index is 38.09 kg/m as calculated from the following:   Height as of this encounter: 5\' 3"  (1.6 m).   Weight as of this encounter: 215 lb (97.5 kg).  @WEIGHTCHANGE @  [employer]    12/09/22 1334  Weight: 215 lb (97.5 kg)     Physical Exam   General: No distress. Barking quality cough Neuro: Alert and Oriented x 3. GCS 15. Speech normal Psych: Pleasant Resp:  Barrel Chest - no.  Wheeze - yes scatteed en inspiratory, Crackles - n, No overt respiratory distress CVS: Normal heart sounds. Murmurs - no Ext: Stigmata of Connective Tissue Disease - no HEENT: Normal upper airway. PEERL +. No post nasal drip  Assessment:       ICD-10-CM   1. Chronic cough  R05.3     2. Bronchitis with bronchospasm  J20.9          Plan:     Patient Instructions     ICD-10-CM   1. Chronic cough  R05.3     2. Bronchitis with bronchospasm  J20.9      This could be postviral reactive cough with upper airway transmitted wheeze versus upper airway cough syndrome otherwise call cough neuropathy or irritable larynx syndrome versus cough variant asthma.  With ongoing prednisone and recent onset it is really hard to sort out what exactly is going on.  Plan - Complete 12-day prednisone [today's day 3] -Check blood test today for pertussis antibodies and blood BNP - In 4 weeks check blood work for CBC with differential and RAST allergy panel   -We need you to be off prednisone for at least 1 week at the time of this blood work -In 4 weeks to full pulmonary function test -Also do voice rest for cough  -Take 2 full days and do not whisper or talk  -Add any urged to cough drink water or suck on sugarless lozenge -Okay to stop schedule DuoNeb but instead take Symbicort 15-20 minutes before exercise and also do warm up and cool down before and after exercise  Follow-up - Return to see myself or nurse practitioner in 4 weeks to review symptoms and cough score  -Based on results and progress might need CT scan of the sinus and HRCT scan of the chest  -Stay in touch with [place]    SIGNATURE    Dr. Brand Males, M.D., F.C.C.P,  Pulmonary and Critical Care Medicine Staff  Physician, [place] Director - Interstitial Lung Disease  Program  Pulmonary [place] [facility] [place], [place], [address]  Pager: [phone], If no answer or between  15:00h - 7:00h: call [phone] Telephone: [phone]  2:10 PM 12/09/2022

## 2022-12-10 LAB — RESPIRATORY ALLERGY PROFILE REGION II ~~LOC~~
Allergen, Cedar tree, t12: 0.14 kU/L — ABNORMAL HIGH
Allergen, Cottonwood, t14: 0.14 kU/L — ABNORMAL HIGH
Allergen, D pternoyssinus,d7: 1.84 kU/L — ABNORMAL HIGH
Allergen, Mulberry, t76: <0.1 kU/L
CLADOSPORIUM HERBARUM (M2) IGE: <0.1 kU/L
Cat Dander: <0.1 kU/L
Class: 0
Class: 0
Class: 0
Class: 0
Class: 0
Class: 0
Class: 0
Class: 0
Class: 0
Class: 2
Class: 2
IgE (Immunoglobulin E), Serum: 87 kU/L (ref <=114)
Johnson Grass: 0.11 kU/L — ABNORMAL HIGH
Pecan/Hickory Tree IgE: <0.1 kU/L
Rough Pigweed  IgE: <0.1 kU/L
Sheep Sorrel IgE: <0.1 kU/L
Timothy Grass: <0.1 kU/L

## 2022-12-14 ENCOUNTER — Encounter: Payer: Self-pay | Admitting: Internal Medicine

## 2022-12-14 LAB — RESPIRATORY ALLERGY PROFILE REGION II ~~LOC~~
Allergen, A. alternata, m6: <0.1 kU/L
Allergen, Comm Silver Birch, t9: <0.1 kU/L
Allergen, Mouse Urine Protein, e78: <0.1 kU/L
Allergen, Oak,t7: <0.1 kU/L
Allergen, P. notatum, m1: <0.1 kU/L
Aspergillus fumigatus, m3: <0.1 kU/L
Bermuda Grass: 0.1 kU/L — ABNORMAL HIGH
Box Elder IgE: 0.1 kU/L — ABNORMAL HIGH
COMMON RAGWEED (SHORT) (W1) IGE: 0.26 kU/L — ABNORMAL HIGH
Class: 0
Class: 0
Class: 0
Class: 0
Class: 3
Cockroach: 0.1 kU/L — ABNORMAL HIGH
D. farinae: 3.64 kU/L — ABNORMAL HIGH
Dog Dander: 1.08 kU/L — ABNORMAL HIGH
Elm IgE: 0.11 kU/L — ABNORMAL HIGH

## 2022-12-14 LAB — DOG DANDER COMPONENT
Can f 4(e229) IgE: <0.1 kU/L (ref <0.10)
Can f 6(e230) IgE: <0.1 kU/L (ref <0.10)
E101-IgE Can f 1: <0.1 kU/L (ref <0.10)
E102-IgE Can f 2: <0.1 kU/L (ref <0.10)
E221-IgE Can f 3: <0.1 kU/L (ref <0.10)
E226-IgE Can f 5: 2.85 kU/L — ABNORMAL HIGH (ref <0.10)

## 2022-12-14 LAB — INTERPRETATION:

## 2022-12-14 NOTE — Telephone Encounter (Signed)
Just to see any 2 days between now and the next visit and will complete voice rest without any talking or whispering.  Of note the blood allergy workup is positive for [place] grass, Johnson grass, dog dander dust mite and few other things.  The BNP is normal.  I think the pertussis is still pending.  If she wants we can make a referral to allergy right now.  Please refer to Dr. Fredderick Phenix or Dr. Donneta Romberg or Dr. Remus Blake    Latest Reference Range & Units 12/09/22 14:48  Pro B Natriuretic peptide (BNP) 0.0 - 100.0 pg/mL 16.0  Interpretation  Pend  WBC 4.0 - 10.5 K/uL 16.5 (H)  RBC 3.87 - 5.11 Mil/uL 5.05  Hemoglobin 12.0 - 15.0 g/dL 14.6  HCT 36.0 - 46.0 % 43.6  MCV 78.0 - 100.0 fl 86.4  MCHC 30.0 - 36.0 g/dL 33.6  RDW 11.5 - 15.5 % 14.2  Platelets 150.0 - 400.0 K/uL 425.0 (H)  Neutrophils 43.0 - 77.0 % 85.5 Repeated and verified X2. (H)  Lymphocytes 12.0 - 46.0 % 12.2  Monocytes Relative 3.0 - 12.0 % 2.1 (L)  Eosinophil 0.0 - 5.0 % 0.0  Basophil 0.0 - 3.0 % 0.2  NEUT# 1.4 - 7.7 K/uL 14.1 (H)  Lymphocyte # 0.7 - 4.0 K/uL 2.0  Monocyte # 0.1 - 1.0 K/uL 0.3  Eosinophils Absolute 0.0 - 0.7 K/uL 0.0  Basophils Absolute 0.0 - 0.1 K/uL 0.0  Sheep Sorrel IgE kU/L <0.10  Pecan/Hickory Tree IgE kU/L <0.10  E101-IgE Can f 1 <0.10 kU/L <0.10  E102-IgE Can f 2 <0.10 kU/L <0.10  E221-IgE Can f 3 <0.10 kU/L <0.10  E226-IgE Can f 5 <0.10 kU/L 2.85 (H)  IgE (Immunoglobulin E), Serum <OR=114 kU/L 87  Allergen, D pternoyssinus,d7 kU/L 1.84 (H)  Cat Dander kU/L <0.10  Dog Dander kU/L 1.08 (H)  [place] Grass kU/L 0.10 (H)  Johnson Grass kU/L 0.11 (H)  Timothy Grass kU/L <0.10  Cockroach kU/L 0.10 (H)  Aspergillus fumigatus, m3 kU/L <0.10  Allergen, Comm Silver Wendee Copp, t9 kU/L <0.10  Allergen, Cottonwood, t14 kU/L 0.14 (H)  Elm IgE kU/L 0.11 (H)  Allergen, Mulberry, t76 kU/L <0.10  Allergen, Oak,t7 kU/L <0.10  COMMON RAGWEED (SHORT) (W1) IGE kU/L 0.26 (H)  Allergen, Mouse Urine Protein, e78 kU/L  <0.10  D. farinae kU/L 3.64 (H)  Allergen, Cedar tree, t12 kU/L 0.14 (H)  Box Elder IgE kU/L 0.10 (H)  Rough Pigweed  IgE kU/L <0.10  Allergen, A. alternata, m6 kU/L <0.10  Allergen, P. notatum, m1 kU/L <0.10  Can f 4(e229) IgE <0.10 kU/L <0.10  Can f 6(e230) IgE <0.10 kU/L <0.10  CLADOSPORIUM HERBARUM (M2) IGE kU/L <0.10  Class  2 0 0 3 0 0 0 0 0 2  0/1 0/1 0 0/1 0/1 0 0/1 0 0 0/1 0 0/1 0/1 0  (H): Data is abnormally high (L): Data is abnormally low

## 2022-12-15 LAB — B PERTUSSIS IGG/IGM AB
B pertussis IgG Ab: 1.74 index — ABNORMAL HIGH (ref 0.00–0.94)
B pertussis IgM Ab, Quant: <1 index (ref 0.0–0.9)

## 2022-12-15 LAB — B PERTUSSIS IGA AB: B pertussis IgA Ab, Quant: <1 index (ref 0.0–0.9)

## 2022-12-16 NOTE — Telephone Encounter (Signed)
Literature does NOT recommend antibiotics if the cough is greater than 6 weeks if it is from pertussis.  Having said that the side effect profile of Z-Pak course is very low.  She could try 1 x Z-Pak course if she is interested  I agree she should see allergist     Allergies  Allergen Reactions   Citalopram Nausea Only

## 2023-01-10 ENCOUNTER — Ambulatory Visit (INDEPENDENT_AMBULATORY_CARE_PROVIDER_SITE_OTHER): Payer: 59 | Admitting: Nurse Practitioner

## 2023-01-10 ENCOUNTER — Encounter: Payer: Self-pay | Admitting: Nurse Practitioner

## 2023-01-10 VITALS — BP 116/80 | HR 70 | Temp 98.3°F | Ht 63.0 in | Wt 216.6 lb

## 2023-01-10 DIAGNOSIS — R058 Other specified cough: Secondary | ICD-10-CM | POA: Insufficient documentation

## 2023-01-10 DIAGNOSIS — D72829 Elevated white blood cell count, unspecified: Secondary | ICD-10-CM | POA: Diagnosis not present

## 2023-01-10 LAB — CBC WITH DIFFERENTIAL/PLATELET
Basophils Absolute: 0.1 10*3/uL (ref 0.0–0.1)
Basophils Relative: 0.5 % (ref 0.0–3.0)
Eosinophils Absolute: 0.1 10*3/uL (ref 0.0–0.7)
Eosinophils Relative: 1.3 % (ref 0.0–5.0)
HCT: 41.4 % (ref 36.0–46.0)
Hemoglobin: 13.9 g/dL (ref 12.0–15.0)
Lymphocytes Relative: 31.2 % (ref 12.0–46.0)
Lymphs Abs: 3 10*3/uL (ref 0.7–4.0)
MCHC: 33.5 g/dL (ref 30.0–36.0)
MCV: 86.9 fl (ref 78.0–100.0)
Monocytes Absolute: 0.7 10*3/uL (ref 0.1–1.0)
Monocytes Relative: 7.4 % (ref 3.0–12.0)
Neutro Abs: 5.8 10*3/uL (ref 1.4–7.7)
Neutrophils Relative %: 59.6 % (ref 43.0–77.0)
Platelets: 361 10*3/uL (ref 150.0–400.0)
RBC: 4.76 Mil/uL (ref 3.87–5.11)
RDW: 14.3 % (ref 11.5–15.5)
WBC: 9.7 10*3/uL (ref 4.0–10.5)

## 2023-01-10 NOTE — Progress Notes (Unsigned)
@Patient  ID: Martha Howell, female    DOB: [birth year], 52 y.o.   MRN: [id number]  Chief Complaint  Patient presents with   Follow-up    Blood work, cough has subsided.     Referring provider: Leonard Downing  HPI: 52 year old female, never smoker followed for recurrent bronchitis. She is a patient of Dr. Jane Canary and last seen in office 12/09/2022. Past medical history significant for anxiety, obesity, ADD, HTN, prediabetes.  TEST/EVENTS:  12/09/2022 IgE nl, allergen panel + to dogs, dust, cockroach, some trees and grasses 12/09/2022: B pertussis IgG positive, IgM and IgA neg 12/09/2022 BNP nl 12/09/2022 FeNO nl  12/09/2022: OV with Dr. Marchelle Gearing. Avid tennis player. Always been well. She does have a mild ragweed allergy but no formal allergy testing in past. Christmas 2023, suffered from what she calls bronchitis. Junky cough with sputum production, periodically dry with barking quality. She did have sinus congestion at the time but no fever. Saw PCP around 09/16/2022 and was given prednisone and Symbicort. Symptoms resolved but then returned end of February 2024. In early March 2024, got another round of prednisone and seemed to be getting better but towards the end of March, symptoms returned again. She got depo-medrol and back on steroids. Nebulizers seem to make things worse. Symptoms exacerbated by talking or laughing. Drinking water helps. CXR at primary was nl per her report. FeNO 8 ppb. Labs obtained - pertussis antibody IgG positive. BNP neg. RAST panel positive to cockroaches, dogs, dust, few trees and grasses. Advised on voice rest for cough. Use symbicort instead of duonebs. May need further workup with HRCT if no improvement/results.   01/10/2023: Today - follow up Patient presents today for follow up. She tells me that she is feeling better and back to normal. Finished steroids around 4/6. Cough is gone. She doesn't feel like she is having any residual symptoms. She has  only used Symbicort a few times before she goes to play tennis but not in response to her symptoms; more so prophylactic. She does occasionally have some reflux when she lays a certain way at night; not using the protonix. Denies any shortness of breath, wheezing, chest congestion, nasal symptoms, dysphagia, orthopnea, PND.   Allergies  Allergen Reactions   Citalopram Nausea Only    Immunization History  Administered Date(s) Administered   Influenza Inj Mdck Quad Pf 06/25/2018, 07/21/2022   Influenza,inj,Quad PF,6+ Mos 06/13/2019   Influenza,inj,Quad PF,6-35 Mos 06/06/2019   Influenza-Unspecified 07/24/2015, 05/29/2016, 06/14/2017   PFIZER(Purple Top)SARS-COV-2 Vaccination 12/02/2019, 12/23/2019, 07/19/2020   Tdap 07/24/2015    Past Medical History:  Diagnosis Date   GERD (gastroesophageal reflux disease)     Tobacco History: Social History   Tobacco Use  Smoking Status Former   Types: Cigarettes   Quit date: 09/13/2000   Years since quitting: 22.3  Smokeless Tobacco Never   Counseling given: Not Answered   Outpatient Medications Prior to Visit  Medication Sig Dispense Refill   albuterol (VENTOLIN HFA) 108 (90 Base) MCG/ACT inhaler Inhale into the lungs.     azelastine (ASTELIN) 0.1 % nasal spray one spray by Both Nostrils route 2 (two) times daily. Use in each nostril as directed     budesonide-formoterol (SYMBICORT) 160-4.5 MCG/ACT inhaler Inhale into the lungs.     ipratropium-albuterol (DUONEB) 0.5-2.5 (3) MG/3ML SOLN Take 3 mLs by nebulization every 4 (four) hours as needed.     sertraline (ZOLOFT) 25 MG tablet      escitalopram (LEXAPRO) 5  MG tablet Take 5 mg by mouth daily. (Patient not taking: Reported on 12/09/2022)     pantoprazole (PROTONIX) 40 MG tablet 40 mg as needed.  (Patient not taking: Reported on 12/09/2022)     predniSONE (STERAPRED UNI-PAK 21 TAB) 10 MG (21) TBPK tablet Take by mouth. (Patient not taking: Reported on 01/10/2023)     No  facility-administered medications prior to visit.     Review of Systems:   Constitutional: No weight loss or gain, night sweats, fevers, chills, fatigue, or lassitude. HEENT: No headaches, difficulty swallowing, tooth/dental problems, or sore throat. No sneezing, itching, ear ache, nasal congestion, or post nasal drip CV:  No chest pain, orthopnea, PND, swelling in lower extremities, anasarca, dizziness, palpitations, syncope Resp: No shortness of breath with exertion or at rest. No excess mucus or change in color of mucus. No productive or non-productive. No hemoptysis. No wheezing.  No chest wall deformity GI:  +occasional indigestion at night. No abdominal pain, nausea, vomiting, diarrhea, change in bowel habits, loss of appetite, bloody stools.  GU: No dysuria, change in color of urine, urgency or frequency.  No flank pain, no hematuria  Skin: No rash, lesions, ulcerations MSK:  No joint pain or swelling.  Neuro: No dizziness or lightheadedness.  Psych: No depression or anxiety. Mood stable.     Physical Exam:  BP 116/80   Pulse 70   Temp 98.3 F (36.8 C) (Oral)   Ht 5\' 3"  (1.6 m)   Wt 216 lb 9.6 oz (98.2 kg)   SpO2 99%   BMI 38.37 kg/m   GEN: Pleasant, interactive, well-appearing; obese; in no acute distress HEENT:  Normocephalic and atraumatic. PERRLA. Sclera white. Nasal turbinates pink, moist and patent bilaterally. No rhinorrhea present. Oropharynx pink and moist, without exudate or edema. No lesions, ulcerations, or postnasal drip.  NECK:  Supple w/ fair ROM. No JVD present. Normal carotid impulses w/o bruits. Thyroid symmetrical with no goiter or nodules palpated. No lymphadenopathy.   CV: RRR, no m/r/g, no peripheral edema. Pulses intact, +2 bilaterally. No cyanosis, pallor or clubbing. PULMONARY:  Unlabored, regular breathing. Clear bilaterally A&P w/o wheezes/rales/rhonchi. No accessory muscle use.  GI: BS present and normoactive. Soft, non-tender to palpation. No  organomegaly or masses detected. MSK: No erythema, warmth or tenderness. Cap refil <2 sec all extrem. No deformities or joint swelling noted.  Neuro: A/Ox3. No focal deficits noted.   Skin: Warm, no lesions or rashe Psych: Normal affect and behavior. Judgement and thought content appropriate.     Lab Results:  CBC    Component Value Date/Time   WBC 16.5 (H) 12/09/2022 1448   RBC 5.05 12/09/2022 1448   HGB 14.6 12/09/2022 1448   HCT 43.6 12/09/2022 1448   PLT 425.0 (H) 12/09/2022 1448   MCV 86.4 12/09/2022 1448   MCHC 33.6 12/09/2022 1448   RDW 14.2 12/09/2022 1448   LYMPHSABS 2.0 12/09/2022 1448   MONOABS 0.3 12/09/2022 1448   EOSABS 0.0 12/09/2022 1448   BASOSABS 0.0 12/09/2022 1448    BMET No results found for: "NA", "K", "CL", "CO2", "GLUCOSE", "BUN", "CREATININE", "CALCIUM", "GFRNONAA", "GFRAA"  BNP No results found for: "BNP"   Imaging:  No results found.        No data to display          No results found for: "NITRICOXIDE"      Assessment & Plan:   Upper airway cough syndrome Likely a postviral cough with upper airway irritation. Possibly exacerbated by allergies  in late February/early March? Resolved now. She did have positive pertussis IgG, which could be related to previous infection or immunization. Will hold off on further workup unless she has recurrence of the cough or wheeze. Plan to recheck CBC today to ensure WBC has normalized. She understands to call if symptoms return.  Patient Instructions  Continue Albuterol inhaler 2 puffs every 6 hours as needed for shortness of breath, cough or wheezing Continue astelin nasal spray 2 sprays each nostril Twice daily as needed for nasal congestion/drainage   Repeat CBC today   Follow up as needed with Dr. Marchelle Gearing or Philis Nettle if your cough returns    I spent 32 minutes of dedicated to the care of this patient on the date of this encounter to include pre-visit review of records,  face-to-face time with the patient discussing conditions above, post visit ordering of testing, clinical documentation with the electronic health record, making appropriate referrals as documented, and communicating necessary findings to members of the patients care team.  Noemi Chapel, NP 01/10/2023  Pt aware and understands NP's role.

## 2023-01-10 NOTE — Assessment & Plan Note (Signed)
Likely a postviral cough with upper airway irritation. Possibly exacerbated by allergies in late February/early March? Resolved now. She did have positive pertussis IgG, which could be related to previous infection or immunization. Will hold off on further workup unless she has recurrence of the cough or wheeze. Plan to recheck CBC today to ensure WBC has normalized. She understands to call if symptoms return.  Patient Instructions  Continue Albuterol inhaler 2 puffs every 6 hours as needed for shortness of breath, cough or wheezing Continue astelin nasal spray 2 sprays each nostril Twice daily as needed for nasal congestion/drainage   Repeat CBC today   Follow up as needed with Dr. Marchelle Gearing or Philis Nettle if your cough returns

## 2023-01-10 NOTE — Patient Instructions (Addendum)
Continue Albuterol inhaler 2 puffs every 6 hours as needed for shortness of breath, cough or wheezing Continue astelin nasal spray 2 sprays each nostril Twice daily as needed for nasal congestion/drainage   Repeat CBC today   Follow up as needed with Dr. Marchelle Gearing or Philis Nettle if your cough returns

## 2023-01-11 ENCOUNTER — Encounter: Payer: Self-pay | Admitting: Nurse Practitioner

## 2023-02-09 ENCOUNTER — Ambulatory Visit: Payer: 59 | Admitting: Internal Medicine

## 2023-10-26 ENCOUNTER — Other Ambulatory Visit: Payer: Self-pay | Admitting: Medical Genetics

## 2024-07-09 ENCOUNTER — Other Ambulatory Visit: Payer: Self-pay | Admitting: Medical Genetics

## 2024-07-09 DIAGNOSIS — Z006 Encounter for examination for normal comparison and control in clinical research program: Secondary | ICD-10-CM

## 2024-08-31 NOTE — Progress Notes (Signed)
 [facility]    Date: 08/31/2024 Patient name: Martha Howell Date of birth: [birth year]   ASSESSMENT/PLAN:  Diagnoses and all orders for this visit: Encounter for annual physical exam -  pap, mammo, colon utd  -     CBC; Future -     Comprehensive Metabolic Panel; Future -     Lipid Panel With LDL/HDL Ratio; Future -     TSH Rfx on Abnormal to Free T4; Future -     Hemoglobin A1c; Future -     Vitamin D 25 Hydroxy; Future Anxiety -  has not yet traveled and taken the buspar. Has trip planned in January   Hearing loss, unspecified hearing loss type, unspecified laterality -  will refer  -     Ambulatory referral to Audiology; Future   Follow up in about 6 months (around 03/01/2025) for anxiety.    Patient expresses understanding of their current medications and use.  We discussed potential side effects, drug interactions, instructions for taking the medication, and the consequences of not taking it. Patient verbalized an understanding of these instructions. Patient is able to verbalize understanding of the care plan discussed today. Patient's medical and personal goals were discussed today.   Patient was instructed to return sooner if no improvement, and we discussed precautions to seek emergency medical care if condition worsens.    SUBJECTIVE:  Martha Howell is a 53 y.o. female that presents to clinic today regarding the following issues: Annual Exam  She is up to date on her screenings for mammo, colon, and pap.   She plays tennis regularly. She goes to the dentist, eye dr.   She does not smoke.   She has not yet taken the buspar as she hasn't traveled yet but she does have an upcoming trip in January and plans to take it then.   She does notice some hearing loss with certain tones and has mentioned that she doesn't hear things sometimes that her family hears and she doesn't.        Reviewed and updated this visit by  provider: Tobacco  Allergies  Meds  Problems  Med Hx  Surg Hx  Fam Hx        Medications Patient's Medications  New Prescriptions   No medications on file  Previous Medications   ACETAMINOPHEN (TYLENOL) 500 MG TABLET    Take one tablet (500 mg dose) by mouth every 6 (six) hours as needed for Pain.   ALBUTEROL SULFATE HFA (PROVENTIL,VENTOLIN,PROAIR) 108 (90 BASE) MCG/ACT INHALER    Inhale two puffs into the lungs every 4 (four) hours as needed for Wheezing.   BUDESONIDE-FORMOTEROL (SYMBICORT) 160-4.5 MCG/ACTUATION INHALER    Inhale two puffs into the lungs 2 (two) times daily.   BUPROPION (WELLBUTRIN) 75 MG TABLET    Take one tablet (75 mg dose) by mouth 3 (three) times a day.   BUSPIRONE (BUSPAR) 5 MG TABLET    Take one tablet (5 mg dose) by mouth 2 (two) times daily.   CETIRIZINE HCL 10 MG CAPS    Take 10 mg by mouth daily as needed.   CONTINUOUS GLUCOSE SENSOR (FREESTYLE LIBRE 3 SENSOR) MISC    Place 1 each onto the skin every 14 (fourteen) days.   ESTRADIOL (ESTRACE) 0.1 MG/GRAM VAGINAL CREAM    SMARTSIG:Vaginal   FLUTICASONE PROPIONATE (FLONASE) 50 MCG/ACTUATION NASAL SPRAY    one spray by Nasal route daily as needed for Allergies.   IBUPROFEN (MOTRIN) 200  MG TABLET    Take one tablet (200 mg dose) by mouth every 8 (eight) hours as needed for Pain.   IPRATROPIUM-ALBUTEROL (DUONEB) 0.5-2.5 MG/3 ML ML SOLN NEBULIZER SOLUTION    Take 3 mLs by nebulization every 4 (four) hours as needed.   ONDANSETRON (ZOFRAN) 4 MG TABLET    Take one tablet (4 mg dose) by mouth every 8 (eight) hours as needed for Nausea.   PANTOPRAZOLE SODIUM (PROTONIX) 40 MG TABLET    Take one tablet (40 mg dose) by mouth daily.   PROGESTERONE (PROMETRIUM) 100 MG CAPSULE    one capsule (100 mg dose).   TIRZEPATIDE-WEIGHT MANAGEMENT (ZEPBOUND) 12.5 MG/0.5 ML SOAJ INJECTION    Inject 0.5 mLs (12.5 mg dose) into the skin once a week.   TRIAMCINOLONE ACETONIDE (KENALOG) 0.1% CREAM    Apply topically 2 (two) times a day  as needed.  Modified Medications   No medications on file  Discontinued Medications   FLUCONAZOLE (DIFLUCAN) 150 MG TABLET    Take 1 tablet once for yeast infection; may repeat once in 3 days if needed   NITROFURANTOIN, MACROCRYSTAL-MONOHYDRATE, (MACROBID) 100 MG CAPSULE    Take one capsule (100 mg dose) by mouth 2 (two) times daily.   TRAZODONE (DESYREL) 50 MG TABLET    25 to 50 mg at bedtime to help with sleep      ROS  Review of Systems  Constitutional: Negative.   HENT:         Hearing concern   Eyes: Negative.   Respiratory: Negative.    Cardiovascular: Negative.   Gastrointestinal: Negative.   Endocrine: Negative.   Genitourinary: Negative.   Musculoskeletal: Negative.   Skin: Negative.   Allergic/Immunologic: Negative.   Neurological: Negative.   Hematological: Negative.   Psychiatric/Behavioral: Negative.       Review of Systems - All other systems reviewed are negative except as noted above.   OBJECTIVE:  Vitals BP 108/76 (Patient Position: Sitting)   Pulse 87   Temp 97.7 F (36.5 C) (Temporal)   Ht 5' 3 (1.6 m)   Wt 171 lb 9.6 oz (77.8 kg)   SpO2 97%   BMI 30.40 kg/m    PHYSICAL:  General Appearance: Alert, oriented and appears in no acute distress. Sitting comfortably in chair. Head: Normocephalic, without obvious abnormality, atraumatic.  Eyes: Pupils equal, round, reactive to light. HENT: Oral mucosa moist without erythema or exudates. Nasal turbinates without erythema, edema or purulent rhinorrhea. TMs intact bilaterally, without erythema, effusion or exudate. Neck: Supple, no adenopathy. No thyromegaly. Respiratory: Good air movement throughout. No accessory muscle use, increased work of breathing or respiratory distress. Clear to auscultation in all lung fields.  Cardiovascular: Regular rate and rhythm, S1, S2 normal, no murmur. No heaves, lifts or thrills palpable. Gastrointestinal: Soft, nontender, non-distended, no masses. Normoactive bowel  sounds present in all four quadrants.  Musculoskeletal:  Normal ROM and strength 5/5 upper and lower extremities blt.      GU: deferred Extremities: No peripheral edema.  Skin: Warm, dry.  Psychiatric: Pleasant. Mood and affect normal. Not anxious or tearful. Neurological: AOx3. Gait normal.    Joesph HILARIO Cedar, PA-C  [facility] [place]  08/31/2024 11:35 AM

## 2024-10-02 ENCOUNTER — Encounter: Payer: Self-pay | Admitting: Audiology

## 2024-10-02 ENCOUNTER — Ambulatory Visit: Attending: Physician Assistant | Admitting: Audiology

## 2024-10-02 DIAGNOSIS — H903 Sensorineural hearing loss, bilateral: Secondary | ICD-10-CM | POA: Diagnosis present

## 2024-10-03 NOTE — Procedures (Signed)
" °  Outpatient Audiology and [facility] [address] [place], [place]  [address] [phone]  AUDIOLOGICAL  EVALUATION  NAME: Martha Howell     DOB:   [birth year]      MRN: [id number]                                                                                     DATE: 10/03/2024     REFERENT: Emilio Joesph DEL, PA-C STATUS: Outpatient DIAGNOSIS: sensorineural hearing loss, bilateral    History: [name] was seen for an audiological evaluation due to concerns regarding her tinnitus and hearing sensitivity. [name] reports her tinnitus can be very bothersome. [name] reports her tinnitus is constant. [name] reports increased difficulty hearing over the past few months. [name] reports difficulty hearing in the presence of background noise. [name] reports difficulty hearing in her work environment. [name] reports a history of dizziness. She denies otalgia and aural fullness.   Evaluation:  Otoscopy showed a clear view of the tympanic membranes, bilaterally Tympanometry results were consistent with normal middle ear function (Type A), bilaterally.  Audiometric testing was completed using Conventional Audiometry techniques with insert earphones and TDH headphones. Test results are consistent with normal hearing sensitivity at [phone] sloping to a moderate sensorineural hearing loss from 3000-8000 Hz. Speech Recognition Thresholds were obtained at 5 dB HL in the right ear and at 10  dB HL in the left ear. Word Recognition Testing was completed at 60 dB HL and [name] scored 100% in both ears.    Results:  The test results were reviewed with [name]. Test results are consistent with normal hearing sensitivity at [phone] sloping to a moderate sensorineural hearing loss from 3000-8000 Hz. [name] will have hearing and communication difficulty in adverse listening environments. She will benefit from the use of good communication strategies. Tinnitus management strategies  were reviewed.   Recommendations: 1.   Use of tinnitus management strategies 2.   Monitor hearing sensitivity. Return in 2-3 years for an audiological evaluation.    Darryle Posey, Au.D., CCC-A Audiologist minutes spent testing and counseling on results.   If you have any questions please feel free to contact me at [phone].  Darryle Posey Audiologist, Au.D., CCC-A 10/03/2024  12:41 PM  Cc: Emilio Joesph DEL, PA-C  "
